# Patient Record
Sex: Female | Born: 1945 | Race: White | Hispanic: No | Smoking: Former smoker
Health system: Southern US, Community
[De-identification: ages and names within clinical notes are randomized; demographics above are authoritative.]

## PROBLEM LIST (undated history)

## (undated) DIAGNOSIS — G2581 Restless legs syndrome: Secondary | ICD-10-CM

## (undated) DIAGNOSIS — K52831 Collagenous colitis: Secondary | ICD-10-CM

## (undated) DIAGNOSIS — E785 Hyperlipidemia, unspecified: Secondary | ICD-10-CM

## (undated) DIAGNOSIS — H8110 Benign paroxysmal vertigo, unspecified ear: Secondary | ICD-10-CM

## (undated) DIAGNOSIS — N182 Chronic kidney disease, stage 2 (mild): Secondary | ICD-10-CM

## (undated) HISTORY — DX: Collagenous colitis: K52.831

## (undated) HISTORY — DX: Restless legs syndrome: G25.81

## (undated) HISTORY — DX: Benign paroxysmal vertigo, unspecified ear: H81.10

## (undated) HISTORY — DX: Hyperlipidemia, unspecified: E78.5

## (undated) HISTORY — PX: OTHER SURGICAL HISTORY: SHX169

## (undated) HISTORY — DX: Chronic kidney disease, stage 2 (mild): N18.2

---

## 1998-08-23 HISTORY — PX: BUNIONECTOMY: SHX129

## 1998-09-01 ENCOUNTER — Other Ambulatory Visit: Admission: RE | Admit: 1998-09-01 | Discharge: 1998-09-01 | Payer: Self-pay | Admitting: Obstetrics & Gynecology

## 1998-09-03 ENCOUNTER — Ambulatory Visit (HOSPITAL_COMMUNITY): Admission: RE | Admit: 1998-09-03 | Discharge: 1998-09-03 | Payer: Self-pay | Admitting: Family Medicine

## 1998-11-13 ENCOUNTER — Ambulatory Visit (HOSPITAL_BASED_OUTPATIENT_CLINIC_OR_DEPARTMENT_OTHER): Admission: RE | Admit: 1998-11-13 | Discharge: 1998-11-13 | Payer: Self-pay | Admitting: Orthopedic Surgery

## 1999-07-13 ENCOUNTER — Encounter: Payer: Self-pay | Admitting: Family Medicine

## 1999-07-13 ENCOUNTER — Encounter: Admission: RE | Admit: 1999-07-13 | Discharge: 1999-07-13 | Payer: Self-pay | Admitting: Family Medicine

## 1999-09-10 ENCOUNTER — Other Ambulatory Visit: Admission: RE | Admit: 1999-09-10 | Discharge: 1999-09-10 | Payer: Self-pay | Admitting: Obstetrics & Gynecology

## 2000-10-04 ENCOUNTER — Other Ambulatory Visit: Admission: RE | Admit: 2000-10-04 | Discharge: 2000-10-04 | Payer: Self-pay | Admitting: Obstetrics & Gynecology

## 2000-11-21 ENCOUNTER — Encounter: Admission: RE | Admit: 2000-11-21 | Discharge: 2000-11-21 | Payer: Self-pay | Admitting: Family Medicine

## 2000-11-21 ENCOUNTER — Encounter: Payer: Self-pay | Admitting: Family Medicine

## 2001-05-24 ENCOUNTER — Other Ambulatory Visit: Admission: RE | Admit: 2001-05-24 | Discharge: 2001-05-24 | Payer: Self-pay | Admitting: Obstetrics & Gynecology

## 2001-05-24 ENCOUNTER — Encounter (INDEPENDENT_AMBULATORY_CARE_PROVIDER_SITE_OTHER): Payer: Self-pay

## 2001-10-25 ENCOUNTER — Other Ambulatory Visit: Admission: RE | Admit: 2001-10-25 | Discharge: 2001-10-25 | Payer: Self-pay | Admitting: Obstetrics & Gynecology

## 2002-11-28 ENCOUNTER — Other Ambulatory Visit: Admission: RE | Admit: 2002-11-28 | Discharge: 2002-11-28 | Payer: Self-pay | Admitting: Obstetrics & Gynecology

## 2003-09-25 ENCOUNTER — Ambulatory Visit (HOSPITAL_COMMUNITY): Admission: RE | Admit: 2003-09-25 | Discharge: 2003-09-25 | Payer: Self-pay | Admitting: Radiation Oncology

## 2003-09-25 ENCOUNTER — Encounter (INDEPENDENT_AMBULATORY_CARE_PROVIDER_SITE_OTHER): Payer: Self-pay | Admitting: *Deleted

## 2004-01-13 ENCOUNTER — Other Ambulatory Visit: Admission: RE | Admit: 2004-01-13 | Discharge: 2004-01-13 | Payer: Self-pay | Admitting: Obstetrics & Gynecology

## 2005-02-10 ENCOUNTER — Other Ambulatory Visit: Admission: RE | Admit: 2005-02-10 | Discharge: 2005-02-10 | Payer: Self-pay | Admitting: Obstetrics & Gynecology

## 2005-02-12 ENCOUNTER — Encounter (INDEPENDENT_AMBULATORY_CARE_PROVIDER_SITE_OTHER): Payer: Self-pay | Admitting: Specialist

## 2005-02-12 ENCOUNTER — Encounter: Admission: RE | Admit: 2005-02-12 | Discharge: 2005-02-12 | Payer: Self-pay | Admitting: Obstetrics & Gynecology

## 2005-02-16 HISTORY — PX: BREAST BIOPSY: SHX20

## 2005-03-01 ENCOUNTER — Encounter: Admission: RE | Admit: 2005-03-01 | Discharge: 2005-03-01 | Payer: Self-pay | Admitting: Obstetrics & Gynecology

## 2005-03-08 ENCOUNTER — Encounter: Admission: RE | Admit: 2005-03-08 | Discharge: 2005-03-08 | Payer: Self-pay | Admitting: Obstetrics & Gynecology

## 2005-09-27 ENCOUNTER — Encounter: Admission: RE | Admit: 2005-09-27 | Discharge: 2005-09-27 | Payer: Self-pay | Admitting: Obstetrics & Gynecology

## 2006-04-20 ENCOUNTER — Encounter: Admission: RE | Admit: 2006-04-20 | Discharge: 2006-04-20 | Payer: Self-pay | Admitting: Obstetrics & Gynecology

## 2006-04-20 ENCOUNTER — Encounter: Admission: RE | Admit: 2006-04-20 | Discharge: 2006-04-20 | Payer: Self-pay | Admitting: Specialist

## 2006-04-27 ENCOUNTER — Encounter: Admission: RE | Admit: 2006-04-27 | Discharge: 2006-04-27 | Payer: Self-pay | Admitting: Obstetrics & Gynecology

## 2006-11-04 ENCOUNTER — Encounter: Admission: RE | Admit: 2006-11-04 | Discharge: 2006-11-04 | Payer: Self-pay | Admitting: Family Medicine

## 2007-05-02 ENCOUNTER — Encounter: Admission: RE | Admit: 2007-05-02 | Discharge: 2007-05-02 | Payer: Self-pay | Admitting: Obstetrics & Gynecology

## 2007-05-17 ENCOUNTER — Encounter: Admission: RE | Admit: 2007-05-17 | Discharge: 2007-05-17 | Payer: Self-pay | Admitting: Obstetrics & Gynecology

## 2008-05-03 ENCOUNTER — Encounter: Admission: RE | Admit: 2008-05-03 | Discharge: 2008-05-03 | Payer: Self-pay | Admitting: Obstetrics & Gynecology

## 2009-03-19 DIAGNOSIS — C4491 Basal cell carcinoma of skin, unspecified: Secondary | ICD-10-CM

## 2009-03-19 HISTORY — DX: Basal cell carcinoma of skin, unspecified: C44.91

## 2009-05-05 ENCOUNTER — Encounter: Admission: RE | Admit: 2009-05-05 | Discharge: 2009-05-05 | Payer: Self-pay | Admitting: Obstetrics & Gynecology

## 2010-05-07 ENCOUNTER — Encounter: Admission: RE | Admit: 2010-05-07 | Discharge: 2010-05-07 | Payer: Self-pay | Admitting: Obstetrics & Gynecology

## 2010-09-13 ENCOUNTER — Encounter: Payer: Self-pay | Admitting: Obstetrics & Gynecology

## 2011-01-08 NOTE — Op Note (Signed)
NAMEERCEL, NORMOYLE                              ACCOUNT NO.:  [id number]   MEDICAL RECORD NO.:  [id number]                   PATIENT TYPE:  AMB   LOCATION:  ENDO                                 FACILITY:  [facility]   PHYSICIAN:  Anselmo Rod, M.D.               DATE OF BIRTH:  [birth year]   DATE OF PROCEDURE:  09/25/2003  DATE OF DISCHARGE:                                 OPERATIVE REPORT   PROCEDURE PERFORMED:  Colonoscopy with random biopsies.   ENDOSCOPIST:  Anselmo Rod, M.D.   INSTRUMENT USED:  Olympus videocolonoscope.   INDICATION FOR THE PROCEDURE:  A 65 year old white female undergoing a  screening colonoscopy.  The patient has had a recent change in bowel habits  with looser stool, rule out colonic polyps, masses, etc.  Random colon  biopsies planned to rule out collagenous or microscopic colitis.   PREPROCEDURE PREPARATION:  Informed consent was procured from the patient.  The patient was fasted for eight hours prior to the procedure and prepped  with a bottle of magnesium citrate and a gallon of GoLYTELY the night prior  to the procedure.   PREPROCEDURE PHYSICAL:  VITAL SIGNS:  Stable.  NECK:  Supple.  CHEST:  Clear to auscultation.  S1 and S2 regular.  ABDOMEN:  Soft with normal bowel sounds.   DESCRIPTION OF THE PROCEDURE:  The patient was placed in the left lateral  decubitus position and sedated with 80 mg of Demerol and 8 mg of Versed  intravenously.  Once the patient was adequately sedated and maintained on  low-flow oxygen and continuous cardiac monitoring, the Olympus  videocolonoscope was advanced from the rectum to the cecum and terminal  ileum without difficulty.  There were a few sigmoid diverticula seen.  There  was some residual stool in the cecum.  The patient's position was changed  from the left lateral to the supine position with gentle application of  abdominal pressure.  The appendiceal orifice and ileocecal valve were  clearly visualized and  photographed.  Random colon biopsies were done to  rule out collagenous colitis.  Sigmoid diverticula were present, but these  were only in early changes of formation.  Small internal hemorrhoids were  seen on retroflexion.  No masses or polyps were seen.   IMPRESSION:  1. Small, nonbleeding internal hemorrhoids.  2. Early sigmoid diverticulosis.  3. Normal-appearing transverse colon, right colon, cecum, and terminal     ileum.   RECOMMENDATIONS:  1. Await pathology results.  2. Give patient brochures on diverticulosis.  3. Continue a high-fiber diet with liberal fluid intake.  4. Outpatient followup in the next two weeks for further recommendations.  Anselmo Rod, M.D.    JNM/MEDQ  D:  09/25/2003  T:  09/25/2003  Job:  [id number]   cc:   Renaye Rakers, M.D.  [phone] [address]  [place]  [place] [address]  Fax: [phone]

## 2011-04-28 ENCOUNTER — Other Ambulatory Visit: Payer: Self-pay | Admitting: Obstetrics & Gynecology

## 2011-04-28 DIAGNOSIS — Z1231 Encounter for screening mammogram for malignant neoplasm of breast: Secondary | ICD-10-CM

## 2011-05-11 ENCOUNTER — Ambulatory Visit: Payer: Self-pay

## 2011-05-14 ENCOUNTER — Ambulatory Visit
Admission: RE | Admit: 2011-05-14 | Discharge: 2011-05-14 | Disposition: A | Discharge status: Home / Self Care | Payer: Medicare Other | Source: Ambulatory Visit | Attending: Obstetrics & Gynecology | Admitting: Obstetrics & Gynecology

## 2011-05-14 DIAGNOSIS — Z1231 Encounter for screening mammogram for malignant neoplasm of breast: Secondary | ICD-10-CM

## 2011-08-25 DIAGNOSIS — G609 Hereditary and idiopathic neuropathy, unspecified: Secondary | ICD-10-CM | POA: Diagnosis not present

## 2011-08-25 DIAGNOSIS — M999 Biomechanical lesion, unspecified: Secondary | ICD-10-CM | POA: Diagnosis not present

## 2011-08-25 DIAGNOSIS — E785 Hyperlipidemia, unspecified: Secondary | ICD-10-CM | POA: Diagnosis not present

## 2011-08-25 DIAGNOSIS — M9981 Other biomechanical lesions of cervical region: Secondary | ICD-10-CM | POA: Diagnosis not present

## 2011-08-25 DIAGNOSIS — S139XXA Sprain of joints and ligaments of unspecified parts of neck, initial encounter: Secondary | ICD-10-CM | POA: Diagnosis not present

## 2011-08-25 DIAGNOSIS — S239XXA Sprain of unspecified parts of thorax, initial encounter: Secondary | ICD-10-CM | POA: Diagnosis not present

## 2011-08-25 DIAGNOSIS — M47817 Spondylosis without myelopathy or radiculopathy, lumbosacral region: Secondary | ICD-10-CM | POA: Diagnosis not present

## 2011-11-10 DIAGNOSIS — S239XXA Sprain of unspecified parts of thorax, initial encounter: Secondary | ICD-10-CM | POA: Diagnosis not present

## 2011-11-10 DIAGNOSIS — S139XXA Sprain of joints and ligaments of unspecified parts of neck, initial encounter: Secondary | ICD-10-CM | POA: Diagnosis not present

## 2011-11-10 DIAGNOSIS — M9981 Other biomechanical lesions of cervical region: Secondary | ICD-10-CM | POA: Diagnosis not present

## 2011-11-10 DIAGNOSIS — M999 Biomechanical lesion, unspecified: Secondary | ICD-10-CM | POA: Diagnosis not present

## 2011-11-10 DIAGNOSIS — M47817 Spondylosis without myelopathy or radiculopathy, lumbosacral region: Secondary | ICD-10-CM | POA: Diagnosis not present

## 2011-11-23 DIAGNOSIS — R197 Diarrhea, unspecified: Secondary | ICD-10-CM | POA: Diagnosis not present

## 2011-11-23 DIAGNOSIS — R1084 Generalized abdominal pain: Secondary | ICD-10-CM | POA: Diagnosis not present

## 2011-11-30 DIAGNOSIS — M999 Biomechanical lesion, unspecified: Secondary | ICD-10-CM | POA: Diagnosis not present

## 2011-11-30 DIAGNOSIS — M9981 Other biomechanical lesions of cervical region: Secondary | ICD-10-CM | POA: Diagnosis not present

## 2011-11-30 DIAGNOSIS — S139XXA Sprain of joints and ligaments of unspecified parts of neck, initial encounter: Secondary | ICD-10-CM | POA: Diagnosis not present

## 2011-11-30 DIAGNOSIS — S239XXA Sprain of unspecified parts of thorax, initial encounter: Secondary | ICD-10-CM | POA: Diagnosis not present

## 2011-11-30 DIAGNOSIS — M47817 Spondylosis without myelopathy or radiculopathy, lumbosacral region: Secondary | ICD-10-CM | POA: Diagnosis not present

## 2011-12-24 DIAGNOSIS — M9981 Other biomechanical lesions of cervical region: Secondary | ICD-10-CM | POA: Diagnosis not present

## 2011-12-24 DIAGNOSIS — M999 Biomechanical lesion, unspecified: Secondary | ICD-10-CM | POA: Diagnosis not present

## 2011-12-24 DIAGNOSIS — M47817 Spondylosis without myelopathy or radiculopathy, lumbosacral region: Secondary | ICD-10-CM | POA: Diagnosis not present

## 2011-12-24 DIAGNOSIS — S239XXA Sprain of unspecified parts of thorax, initial encounter: Secondary | ICD-10-CM | POA: Diagnosis not present

## 2011-12-24 DIAGNOSIS — S139XXA Sprain of joints and ligaments of unspecified parts of neck, initial encounter: Secondary | ICD-10-CM | POA: Diagnosis not present

## 2011-12-30 ENCOUNTER — Other Ambulatory Visit: Payer: Self-pay | Admitting: Internal Medicine

## 2011-12-30 DIAGNOSIS — R109 Unspecified abdominal pain: Secondary | ICD-10-CM | POA: Diagnosis not present

## 2011-12-30 DIAGNOSIS — N939 Abnormal uterine and vaginal bleeding, unspecified: Secondary | ICD-10-CM

## 2011-12-30 DIAGNOSIS — R1032 Left lower quadrant pain: Secondary | ICD-10-CM

## 2011-12-30 DIAGNOSIS — R1031 Right lower quadrant pain: Secondary | ICD-10-CM

## 2011-12-31 ENCOUNTER — Ambulatory Visit
Admission: RE | Admit: 2011-12-31 | Discharge: 2011-12-31 | Disposition: A | Discharge status: Home / Self Care | Payer: Medicare Other | Source: Ambulatory Visit | Attending: Internal Medicine | Admitting: Internal Medicine

## 2011-12-31 DIAGNOSIS — Z780 Asymptomatic menopausal state: Secondary | ICD-10-CM | POA: Diagnosis not present

## 2011-12-31 DIAGNOSIS — R9389 Abnormal findings on diagnostic imaging of other specified body structures: Secondary | ICD-10-CM | POA: Diagnosis not present

## 2011-12-31 DIAGNOSIS — R1032 Left lower quadrant pain: Secondary | ICD-10-CM

## 2011-12-31 DIAGNOSIS — N939 Abnormal uterine and vaginal bleeding, unspecified: Secondary | ICD-10-CM

## 2011-12-31 DIAGNOSIS — R109 Unspecified abdominal pain: Secondary | ICD-10-CM | POA: Diagnosis not present

## 2011-12-31 DIAGNOSIS — R1031 Right lower quadrant pain: Secondary | ICD-10-CM

## 2012-01-10 DIAGNOSIS — N83209 Unspecified ovarian cyst, unspecified side: Secondary | ICD-10-CM | POA: Diagnosis not present

## 2012-01-10 DIAGNOSIS — N950 Postmenopausal bleeding: Secondary | ICD-10-CM | POA: Diagnosis not present

## 2012-01-10 DIAGNOSIS — N8500 Endometrial hyperplasia, unspecified: Secondary | ICD-10-CM | POA: Diagnosis not present

## 2012-01-20 ENCOUNTER — Other Ambulatory Visit: Payer: Self-pay | Admitting: Obstetrics & Gynecology

## 2012-01-20 DIAGNOSIS — D260 Other benign neoplasm of cervix uteri: Secondary | ICD-10-CM | POA: Diagnosis not present

## 2012-01-20 DIAGNOSIS — N950 Postmenopausal bleeding: Secondary | ICD-10-CM | POA: Diagnosis not present

## 2012-01-20 DIAGNOSIS — R9389 Abnormal findings on diagnostic imaging of other specified body structures: Secondary | ICD-10-CM | POA: Diagnosis not present

## 2012-01-20 DIAGNOSIS — D261 Other benign neoplasm of corpus uteri: Secondary | ICD-10-CM | POA: Diagnosis not present

## 2012-02-07 DIAGNOSIS — M999 Biomechanical lesion, unspecified: Secondary | ICD-10-CM | POA: Diagnosis not present

## 2012-02-07 DIAGNOSIS — S239XXA Sprain of unspecified parts of thorax, initial encounter: Secondary | ICD-10-CM | POA: Diagnosis not present

## 2012-02-07 DIAGNOSIS — M9981 Other biomechanical lesions of cervical region: Secondary | ICD-10-CM | POA: Diagnosis not present

## 2012-02-07 DIAGNOSIS — M47817 Spondylosis without myelopathy or radiculopathy, lumbosacral region: Secondary | ICD-10-CM | POA: Diagnosis not present

## 2012-02-07 DIAGNOSIS — S139XXA Sprain of joints and ligaments of unspecified parts of neck, initial encounter: Secondary | ICD-10-CM | POA: Diagnosis not present

## 2012-02-22 DIAGNOSIS — E669 Obesity, unspecified: Secondary | ICD-10-CM | POA: Diagnosis not present

## 2012-02-22 DIAGNOSIS — G609 Hereditary and idiopathic neuropathy, unspecified: Secondary | ICD-10-CM | POA: Diagnosis not present

## 2012-02-22 DIAGNOSIS — Z0000 Encounter for general adult medical examination without abnormal findings: Secondary | ICD-10-CM | POA: Diagnosis not present

## 2012-02-22 DIAGNOSIS — E785 Hyperlipidemia, unspecified: Secondary | ICD-10-CM | POA: Diagnosis not present

## 2012-02-23 DIAGNOSIS — M79609 Pain in unspecified limb: Secondary | ICD-10-CM | POA: Diagnosis not present

## 2012-02-23 DIAGNOSIS — Q828 Other specified congenital malformations of skin: Secondary | ICD-10-CM | POA: Diagnosis not present

## 2012-02-23 DIAGNOSIS — B351 Tinea unguium: Secondary | ICD-10-CM | POA: Diagnosis not present

## 2012-02-29 DIAGNOSIS — H8110 Benign paroxysmal vertigo, unspecified ear: Secondary | ICD-10-CM | POA: Diagnosis not present

## 2012-02-29 DIAGNOSIS — E785 Hyperlipidemia, unspecified: Secondary | ICD-10-CM | POA: Diagnosis not present

## 2012-02-29 DIAGNOSIS — G609 Hereditary and idiopathic neuropathy, unspecified: Secondary | ICD-10-CM | POA: Diagnosis not present

## 2012-02-29 DIAGNOSIS — N182 Chronic kidney disease, stage 2 (mild): Secondary | ICD-10-CM | POA: Diagnosis not present

## 2012-03-01 DIAGNOSIS — M999 Biomechanical lesion, unspecified: Secondary | ICD-10-CM | POA: Diagnosis not present

## 2012-03-01 DIAGNOSIS — M47817 Spondylosis without myelopathy or radiculopathy, lumbosacral region: Secondary | ICD-10-CM | POA: Diagnosis not present

## 2012-03-01 DIAGNOSIS — S239XXA Sprain of unspecified parts of thorax, initial encounter: Secondary | ICD-10-CM | POA: Diagnosis not present

## 2012-03-01 DIAGNOSIS — M9981 Other biomechanical lesions of cervical region: Secondary | ICD-10-CM | POA: Diagnosis not present

## 2012-03-01 DIAGNOSIS — S139XXA Sprain of joints and ligaments of unspecified parts of neck, initial encounter: Secondary | ICD-10-CM | POA: Diagnosis not present

## 2012-04-21 ENCOUNTER — Other Ambulatory Visit: Payer: Self-pay | Admitting: Obstetrics & Gynecology

## 2012-04-21 DIAGNOSIS — Z1231 Encounter for screening mammogram for malignant neoplasm of breast: Secondary | ICD-10-CM

## 2012-04-26 DIAGNOSIS — M9981 Other biomechanical lesions of cervical region: Secondary | ICD-10-CM | POA: Diagnosis not present

## 2012-04-26 DIAGNOSIS — S239XXA Sprain of unspecified parts of thorax, initial encounter: Secondary | ICD-10-CM | POA: Diagnosis not present

## 2012-04-26 DIAGNOSIS — M47817 Spondylosis without myelopathy or radiculopathy, lumbosacral region: Secondary | ICD-10-CM | POA: Diagnosis not present

## 2012-04-26 DIAGNOSIS — M999 Biomechanical lesion, unspecified: Secondary | ICD-10-CM | POA: Diagnosis not present

## 2012-04-26 DIAGNOSIS — S139XXA Sprain of joints and ligaments of unspecified parts of neck, initial encounter: Secondary | ICD-10-CM | POA: Diagnosis not present

## 2012-05-01 DIAGNOSIS — Z23 Encounter for immunization: Secondary | ICD-10-CM | POA: Diagnosis not present

## 2012-05-01 DIAGNOSIS — N83209 Unspecified ovarian cyst, unspecified side: Secondary | ICD-10-CM | POA: Diagnosis not present

## 2012-05-01 DIAGNOSIS — N951 Menopausal and female climacteric states: Secondary | ICD-10-CM | POA: Diagnosis not present

## 2012-05-15 ENCOUNTER — Ambulatory Visit
Admission: RE | Admit: 2012-05-15 | Discharge: 2012-05-15 | Disposition: A | Discharge status: Home / Self Care | Payer: Medicare Other | Source: Ambulatory Visit | Attending: Obstetrics & Gynecology | Admitting: Obstetrics & Gynecology

## 2012-05-15 DIAGNOSIS — Z1231 Encounter for screening mammogram for malignant neoplasm of breast: Secondary | ICD-10-CM | POA: Diagnosis not present

## 2012-05-16 ENCOUNTER — Ambulatory Visit: Payer: Medicare Other

## 2012-05-24 DIAGNOSIS — M9981 Other biomechanical lesions of cervical region: Secondary | ICD-10-CM | POA: Diagnosis not present

## 2012-05-24 DIAGNOSIS — M47817 Spondylosis without myelopathy or radiculopathy, lumbosacral region: Secondary | ICD-10-CM | POA: Diagnosis not present

## 2012-05-24 DIAGNOSIS — M999 Biomechanical lesion, unspecified: Secondary | ICD-10-CM | POA: Diagnosis not present

## 2012-05-24 DIAGNOSIS — S239XXA Sprain of unspecified parts of thorax, initial encounter: Secondary | ICD-10-CM | POA: Diagnosis not present

## 2012-05-24 DIAGNOSIS — S139XXA Sprain of joints and ligaments of unspecified parts of neck, initial encounter: Secondary | ICD-10-CM | POA: Diagnosis not present

## 2012-06-15 DIAGNOSIS — H40019 Open angle with borderline findings, low risk, unspecified eye: Secondary | ICD-10-CM | POA: Diagnosis not present

## 2012-06-26 DIAGNOSIS — M9981 Other biomechanical lesions of cervical region: Secondary | ICD-10-CM | POA: Diagnosis not present

## 2012-06-26 DIAGNOSIS — S139XXA Sprain of joints and ligaments of unspecified parts of neck, initial encounter: Secondary | ICD-10-CM | POA: Diagnosis not present

## 2012-06-26 DIAGNOSIS — M47817 Spondylosis without myelopathy or radiculopathy, lumbosacral region: Secondary | ICD-10-CM | POA: Diagnosis not present

## 2012-06-26 DIAGNOSIS — M999 Biomechanical lesion, unspecified: Secondary | ICD-10-CM | POA: Diagnosis not present

## 2012-06-26 DIAGNOSIS — S239XXA Sprain of unspecified parts of thorax, initial encounter: Secondary | ICD-10-CM | POA: Diagnosis not present

## 2012-06-29 DIAGNOSIS — Q828 Other specified congenital malformations of skin: Secondary | ICD-10-CM | POA: Diagnosis not present

## 2012-07-21 DIAGNOSIS — S239XXA Sprain of unspecified parts of thorax, initial encounter: Secondary | ICD-10-CM | POA: Diagnosis not present

## 2012-07-21 DIAGNOSIS — S139XXA Sprain of joints and ligaments of unspecified parts of neck, initial encounter: Secondary | ICD-10-CM | POA: Diagnosis not present

## 2012-07-21 DIAGNOSIS — M999 Biomechanical lesion, unspecified: Secondary | ICD-10-CM | POA: Diagnosis not present

## 2012-07-21 DIAGNOSIS — M9981 Other biomechanical lesions of cervical region: Secondary | ICD-10-CM | POA: Diagnosis not present

## 2012-07-21 DIAGNOSIS — M47817 Spondylosis without myelopathy or radiculopathy, lumbosacral region: Secondary | ICD-10-CM | POA: Diagnosis not present

## 2012-11-10 DIAGNOSIS — M47817 Spondylosis without myelopathy or radiculopathy, lumbosacral region: Secondary | ICD-10-CM | POA: Diagnosis not present

## 2012-11-10 DIAGNOSIS — M999 Biomechanical lesion, unspecified: Secondary | ICD-10-CM | POA: Diagnosis not present

## 2012-11-10 DIAGNOSIS — M9981 Other biomechanical lesions of cervical region: Secondary | ICD-10-CM | POA: Diagnosis not present

## 2012-11-10 DIAGNOSIS — S239XXA Sprain of unspecified parts of thorax, initial encounter: Secondary | ICD-10-CM | POA: Diagnosis not present

## 2012-11-10 DIAGNOSIS — S139XXA Sprain of joints and ligaments of unspecified parts of neck, initial encounter: Secondary | ICD-10-CM | POA: Diagnosis not present

## 2012-11-22 DIAGNOSIS — E785 Hyperlipidemia, unspecified: Secondary | ICD-10-CM | POA: Diagnosis not present

## 2012-11-27 DIAGNOSIS — Z1212 Encounter for screening for malignant neoplasm of rectum: Secondary | ICD-10-CM | POA: Diagnosis not present

## 2012-11-27 DIAGNOSIS — Z130 Encounter for screening for diseases of the blood and blood-forming organs and certain disorders involving the immune mechanism: Secondary | ICD-10-CM | POA: Diagnosis not present

## 2012-11-27 DIAGNOSIS — Z124 Encounter for screening for malignant neoplasm of cervix: Secondary | ICD-10-CM | POA: Diagnosis not present

## 2012-11-29 DIAGNOSIS — G609 Hereditary and idiopathic neuropathy, unspecified: Secondary | ICD-10-CM | POA: Diagnosis not present

## 2012-11-29 DIAGNOSIS — E785 Hyperlipidemia, unspecified: Secondary | ICD-10-CM | POA: Diagnosis not present

## 2012-11-29 DIAGNOSIS — N182 Chronic kidney disease, stage 2 (mild): Secondary | ICD-10-CM | POA: Diagnosis not present

## 2012-12-25 DIAGNOSIS — S139XXA Sprain of joints and ligaments of unspecified parts of neck, initial encounter: Secondary | ICD-10-CM | POA: Diagnosis not present

## 2012-12-25 DIAGNOSIS — M999 Biomechanical lesion, unspecified: Secondary | ICD-10-CM | POA: Diagnosis not present

## 2012-12-25 DIAGNOSIS — S239XXA Sprain of unspecified parts of thorax, initial encounter: Secondary | ICD-10-CM | POA: Diagnosis not present

## 2012-12-25 DIAGNOSIS — M47817 Spondylosis without myelopathy or radiculopathy, lumbosacral region: Secondary | ICD-10-CM | POA: Diagnosis not present

## 2012-12-25 DIAGNOSIS — M9981 Other biomechanical lesions of cervical region: Secondary | ICD-10-CM | POA: Diagnosis not present

## 2013-01-22 DIAGNOSIS — S139XXA Sprain of joints and ligaments of unspecified parts of neck, initial encounter: Secondary | ICD-10-CM | POA: Diagnosis not present

## 2013-01-22 DIAGNOSIS — S239XXA Sprain of unspecified parts of thorax, initial encounter: Secondary | ICD-10-CM | POA: Diagnosis not present

## 2013-01-22 DIAGNOSIS — M999 Biomechanical lesion, unspecified: Secondary | ICD-10-CM | POA: Diagnosis not present

## 2013-01-22 DIAGNOSIS — M9981 Other biomechanical lesions of cervical region: Secondary | ICD-10-CM | POA: Diagnosis not present

## 2013-01-22 DIAGNOSIS — M47817 Spondylosis without myelopathy or radiculopathy, lumbosacral region: Secondary | ICD-10-CM | POA: Diagnosis not present

## 2013-01-24 DIAGNOSIS — M999 Biomechanical lesion, unspecified: Secondary | ICD-10-CM | POA: Diagnosis not present

## 2013-01-24 DIAGNOSIS — M47817 Spondylosis without myelopathy or radiculopathy, lumbosacral region: Secondary | ICD-10-CM | POA: Diagnosis not present

## 2013-01-24 DIAGNOSIS — M9981 Other biomechanical lesions of cervical region: Secondary | ICD-10-CM | POA: Diagnosis not present

## 2013-01-24 DIAGNOSIS — S139XXA Sprain of joints and ligaments of unspecified parts of neck, initial encounter: Secondary | ICD-10-CM | POA: Diagnosis not present

## 2013-01-24 DIAGNOSIS — S239XXA Sprain of unspecified parts of thorax, initial encounter: Secondary | ICD-10-CM | POA: Diagnosis not present

## 2013-01-26 DIAGNOSIS — M999 Biomechanical lesion, unspecified: Secondary | ICD-10-CM | POA: Diagnosis not present

## 2013-01-26 DIAGNOSIS — M47817 Spondylosis without myelopathy or radiculopathy, lumbosacral region: Secondary | ICD-10-CM | POA: Diagnosis not present

## 2013-01-26 DIAGNOSIS — S139XXA Sprain of joints and ligaments of unspecified parts of neck, initial encounter: Secondary | ICD-10-CM | POA: Diagnosis not present

## 2013-01-26 DIAGNOSIS — S239XXA Sprain of unspecified parts of thorax, initial encounter: Secondary | ICD-10-CM | POA: Diagnosis not present

## 2013-01-26 DIAGNOSIS — M9981 Other biomechanical lesions of cervical region: Secondary | ICD-10-CM | POA: Diagnosis not present

## 2013-01-31 DIAGNOSIS — S239XXA Sprain of unspecified parts of thorax, initial encounter: Secondary | ICD-10-CM | POA: Diagnosis not present

## 2013-01-31 DIAGNOSIS — M999 Biomechanical lesion, unspecified: Secondary | ICD-10-CM | POA: Diagnosis not present

## 2013-01-31 DIAGNOSIS — S139XXA Sprain of joints and ligaments of unspecified parts of neck, initial encounter: Secondary | ICD-10-CM | POA: Diagnosis not present

## 2013-01-31 DIAGNOSIS — M9981 Other biomechanical lesions of cervical region: Secondary | ICD-10-CM | POA: Diagnosis not present

## 2013-01-31 DIAGNOSIS — M47817 Spondylosis without myelopathy or radiculopathy, lumbosacral region: Secondary | ICD-10-CM | POA: Diagnosis not present

## 2013-02-09 DIAGNOSIS — S139XXA Sprain of joints and ligaments of unspecified parts of neck, initial encounter: Secondary | ICD-10-CM | POA: Diagnosis not present

## 2013-02-09 DIAGNOSIS — M47817 Spondylosis without myelopathy or radiculopathy, lumbosacral region: Secondary | ICD-10-CM | POA: Diagnosis not present

## 2013-02-09 DIAGNOSIS — M999 Biomechanical lesion, unspecified: Secondary | ICD-10-CM | POA: Diagnosis not present

## 2013-02-09 DIAGNOSIS — S239XXA Sprain of unspecified parts of thorax, initial encounter: Secondary | ICD-10-CM | POA: Diagnosis not present

## 2013-02-09 DIAGNOSIS — M9981 Other biomechanical lesions of cervical region: Secondary | ICD-10-CM | POA: Diagnosis not present

## 2013-03-27 DIAGNOSIS — M9981 Other biomechanical lesions of cervical region: Secondary | ICD-10-CM | POA: Diagnosis not present

## 2013-03-27 DIAGNOSIS — M999 Biomechanical lesion, unspecified: Secondary | ICD-10-CM | POA: Diagnosis not present

## 2013-03-27 DIAGNOSIS — S139XXA Sprain of joints and ligaments of unspecified parts of neck, initial encounter: Secondary | ICD-10-CM | POA: Diagnosis not present

## 2013-03-27 DIAGNOSIS — S239XXA Sprain of unspecified parts of thorax, initial encounter: Secondary | ICD-10-CM | POA: Diagnosis not present

## 2013-03-27 DIAGNOSIS — M47817 Spondylosis without myelopathy or radiculopathy, lumbosacral region: Secondary | ICD-10-CM | POA: Diagnosis not present

## 2013-03-28 DIAGNOSIS — Z23 Encounter for immunization: Secondary | ICD-10-CM | POA: Diagnosis not present

## 2013-04-10 ENCOUNTER — Other Ambulatory Visit: Payer: Self-pay

## 2013-04-10 DIAGNOSIS — Z1231 Encounter for screening mammogram for malignant neoplasm of breast: Secondary | ICD-10-CM

## 2013-04-27 DIAGNOSIS — M9981 Other biomechanical lesions of cervical region: Secondary | ICD-10-CM | POA: Diagnosis not present

## 2013-04-27 DIAGNOSIS — S139XXA Sprain of joints and ligaments of unspecified parts of neck, initial encounter: Secondary | ICD-10-CM | POA: Diagnosis not present

## 2013-04-27 DIAGNOSIS — M47817 Spondylosis without myelopathy or radiculopathy, lumbosacral region: Secondary | ICD-10-CM | POA: Diagnosis not present

## 2013-04-27 DIAGNOSIS — M999 Biomechanical lesion, unspecified: Secondary | ICD-10-CM | POA: Diagnosis not present

## 2013-04-27 DIAGNOSIS — S239XXA Sprain of unspecified parts of thorax, initial encounter: Secondary | ICD-10-CM | POA: Diagnosis not present

## 2013-05-01 DIAGNOSIS — R159 Full incontinence of feces: Secondary | ICD-10-CM | POA: Diagnosis not present

## 2013-05-01 DIAGNOSIS — R141 Gas pain: Secondary | ICD-10-CM | POA: Diagnosis not present

## 2013-05-01 DIAGNOSIS — K5190 Ulcerative colitis, unspecified, without complications: Secondary | ICD-10-CM | POA: Diagnosis not present

## 2013-05-17 ENCOUNTER — Ambulatory Visit
Admission: RE | Admit: 2013-05-17 | Discharge: 2013-05-17 | Disposition: A | Discharge status: Home / Self Care | Payer: Medicare Other | Source: Ambulatory Visit

## 2013-05-17 DIAGNOSIS — Z1231 Encounter for screening mammogram for malignant neoplasm of breast: Secondary | ICD-10-CM

## 2013-06-06 ENCOUNTER — Other Ambulatory Visit: Payer: Self-pay | Admitting: Obstetrics & Gynecology

## 2013-06-06 DIAGNOSIS — R922 Inconclusive mammogram: Secondary | ICD-10-CM

## 2013-06-11 DIAGNOSIS — N182 Chronic kidney disease, stage 2 (mild): Secondary | ICD-10-CM | POA: Diagnosis not present

## 2013-06-11 DIAGNOSIS — G609 Hereditary and idiopathic neuropathy, unspecified: Secondary | ICD-10-CM | POA: Diagnosis not present

## 2013-06-11 DIAGNOSIS — Z1331 Encounter for screening for depression: Secondary | ICD-10-CM | POA: Diagnosis not present

## 2013-06-11 DIAGNOSIS — Z0000 Encounter for general adult medical examination without abnormal findings: Secondary | ICD-10-CM | POA: Diagnosis not present

## 2013-06-11 DIAGNOSIS — E785 Hyperlipidemia, unspecified: Secondary | ICD-10-CM | POA: Diagnosis not present

## 2013-06-11 DIAGNOSIS — E669 Obesity, unspecified: Secondary | ICD-10-CM | POA: Diagnosis not present

## 2013-06-12 DIAGNOSIS — N950 Postmenopausal bleeding: Secondary | ICD-10-CM | POA: Diagnosis not present

## 2013-06-18 DIAGNOSIS — G609 Hereditary and idiopathic neuropathy, unspecified: Secondary | ICD-10-CM | POA: Diagnosis not present

## 2013-06-18 DIAGNOSIS — G2581 Restless legs syndrome: Secondary | ICD-10-CM | POA: Diagnosis not present

## 2013-06-18 DIAGNOSIS — E785 Hyperlipidemia, unspecified: Secondary | ICD-10-CM | POA: Diagnosis not present

## 2013-06-18 DIAGNOSIS — N182 Chronic kidney disease, stage 2 (mild): Secondary | ICD-10-CM | POA: Diagnosis not present

## 2013-06-21 DIAGNOSIS — H2510 Age-related nuclear cataract, unspecified eye: Secondary | ICD-10-CM | POA: Diagnosis not present

## 2013-06-21 DIAGNOSIS — H40019 Open angle with borderline findings, low risk, unspecified eye: Secondary | ICD-10-CM | POA: Diagnosis not present

## 2013-06-25 DIAGNOSIS — M47817 Spondylosis without myelopathy or radiculopathy, lumbosacral region: Secondary | ICD-10-CM | POA: Diagnosis not present

## 2013-06-25 DIAGNOSIS — S239XXA Sprain of unspecified parts of thorax, initial encounter: Secondary | ICD-10-CM | POA: Diagnosis not present

## 2013-06-25 DIAGNOSIS — M999 Biomechanical lesion, unspecified: Secondary | ICD-10-CM | POA: Diagnosis not present

## 2013-06-25 DIAGNOSIS — M9981 Other biomechanical lesions of cervical region: Secondary | ICD-10-CM | POA: Diagnosis not present

## 2013-06-25 DIAGNOSIS — S139XXA Sprain of joints and ligaments of unspecified parts of neck, initial encounter: Secondary | ICD-10-CM | POA: Diagnosis not present

## 2013-06-29 ENCOUNTER — Ambulatory Visit
Admission: RE | Admit: 2013-06-29 | Discharge: 2013-06-29 | Disposition: A | Discharge status: Home / Self Care | Payer: Medicare Other | Source: Ambulatory Visit | Attending: Obstetrics & Gynecology | Admitting: Obstetrics & Gynecology

## 2013-06-29 DIAGNOSIS — R922 Inconclusive mammogram: Secondary | ICD-10-CM

## 2013-06-29 MED ORDER — GADOBENATE DIMEGLUMINE 529 MG/ML IV SOLN
17.0000 mL | Freq: Once | INTRAVENOUS | Status: AC | PRN
  Administered 2013-06-29: 17 mL via INTRAVENOUS

## 2013-09-02 DIAGNOSIS — R059 Cough, unspecified: Secondary | ICD-10-CM | POA: Diagnosis not present

## 2013-09-02 DIAGNOSIS — J069 Acute upper respiratory infection, unspecified: Secondary | ICD-10-CM | POA: Diagnosis not present

## 2013-09-02 DIAGNOSIS — R05 Cough: Secondary | ICD-10-CM | POA: Diagnosis not present

## 2013-11-19 DIAGNOSIS — S139XXA Sprain of joints and ligaments of unspecified parts of neck, initial encounter: Secondary | ICD-10-CM | POA: Diagnosis not present

## 2013-11-19 DIAGNOSIS — S239XXA Sprain of unspecified parts of thorax, initial encounter: Secondary | ICD-10-CM | POA: Diagnosis not present

## 2013-11-19 DIAGNOSIS — M47817 Spondylosis without myelopathy or radiculopathy, lumbosacral region: Secondary | ICD-10-CM | POA: Diagnosis not present

## 2013-11-19 DIAGNOSIS — M9981 Other biomechanical lesions of cervical region: Secondary | ICD-10-CM | POA: Diagnosis not present

## 2013-11-19 DIAGNOSIS — M999 Biomechanical lesion, unspecified: Secondary | ICD-10-CM | POA: Diagnosis not present

## 2013-12-03 DIAGNOSIS — M999 Biomechanical lesion, unspecified: Secondary | ICD-10-CM | POA: Diagnosis not present

## 2013-12-03 DIAGNOSIS — S239XXA Sprain of unspecified parts of thorax, initial encounter: Secondary | ICD-10-CM | POA: Diagnosis not present

## 2013-12-03 DIAGNOSIS — S139XXA Sprain of joints and ligaments of unspecified parts of neck, initial encounter: Secondary | ICD-10-CM | POA: Diagnosis not present

## 2013-12-03 DIAGNOSIS — M47817 Spondylosis without myelopathy or radiculopathy, lumbosacral region: Secondary | ICD-10-CM | POA: Diagnosis not present

## 2013-12-03 DIAGNOSIS — M9981 Other biomechanical lesions of cervical region: Secondary | ICD-10-CM | POA: Diagnosis not present

## 2013-12-05 DIAGNOSIS — Z01419 Encounter for gynecological examination (general) (routine) without abnormal findings: Secondary | ICD-10-CM | POA: Diagnosis not present

## 2013-12-10 DIAGNOSIS — E785 Hyperlipidemia, unspecified: Secondary | ICD-10-CM | POA: Diagnosis not present

## 2013-12-17 DIAGNOSIS — H8110 Benign paroxysmal vertigo, unspecified ear: Secondary | ICD-10-CM | POA: Diagnosis not present

## 2013-12-17 DIAGNOSIS — E785 Hyperlipidemia, unspecified: Secondary | ICD-10-CM | POA: Diagnosis not present

## 2013-12-17 DIAGNOSIS — N182 Chronic kidney disease, stage 2 (mild): Secondary | ICD-10-CM | POA: Diagnosis not present

## 2013-12-17 DIAGNOSIS — G609 Hereditary and idiopathic neuropathy, unspecified: Secondary | ICD-10-CM | POA: Diagnosis not present

## 2013-12-18 DIAGNOSIS — M47817 Spondylosis without myelopathy or radiculopathy, lumbosacral region: Secondary | ICD-10-CM | POA: Diagnosis not present

## 2013-12-18 DIAGNOSIS — M999 Biomechanical lesion, unspecified: Secondary | ICD-10-CM | POA: Diagnosis not present

## 2013-12-18 DIAGNOSIS — M9981 Other biomechanical lesions of cervical region: Secondary | ICD-10-CM | POA: Diagnosis not present

## 2013-12-18 DIAGNOSIS — S139XXA Sprain of joints and ligaments of unspecified parts of neck, initial encounter: Secondary | ICD-10-CM | POA: Diagnosis not present

## 2013-12-18 DIAGNOSIS — S239XXA Sprain of unspecified parts of thorax, initial encounter: Secondary | ICD-10-CM | POA: Diagnosis not present

## 2013-12-21 ENCOUNTER — Encounter: Payer: Self-pay | Admitting: Podiatrist

## 2013-12-21 ENCOUNTER — Ambulatory Visit (INDEPENDENT_AMBULATORY_CARE_PROVIDER_SITE_OTHER): Payer: Medicare Other | Admitting: Podiatrist

## 2013-12-21 VITALS — BP 129/82 | HR 73 | Resp 18

## 2013-12-21 DIAGNOSIS — M216X9 Other acquired deformities of unspecified foot: Secondary | ICD-10-CM | POA: Diagnosis not present

## 2013-12-21 DIAGNOSIS — Q828 Other specified congenital malformations of skin: Secondary | ICD-10-CM

## 2013-12-21 DIAGNOSIS — L84 Corns and callosities: Secondary | ICD-10-CM | POA: Diagnosis not present

## 2013-12-21 NOTE — Progress Notes (Signed)
   Subjective:    Patient ID: Norma Nicholson, female    DOB: [birth year], 68 y.o.   MRN: [id number]  HPI I have calluses on both feet and has been bothering me for about two months and hurts with shoes and hurts to stand on them    Review of Systems  Constitutional: Positive for unexpected weight change.  HENT:       Ringing in ears  Musculoskeletal: Positive for gait problem.  All other systems reviewed and are negative.      Objective:   Physical Exam GENERAL APPEARANCE: Alert, conversant. Appropriately groomed. No acute distress.  VASCULAR: Pedal pulses palpable at 2/4 DP and PT bilateral.  Capillary refill time is immediate to all digits,  Proximal to distal cooling it warm to warm.  Digital hair growth is present bilateral  NEUROLOGIC: sensation is intact epicritically and protectively to 5.07 monofilament at 5/5 sites bilateral.  Light touch is intact bilateral, vibratory sensation intact bilateral, achilles tendon reflex is intact bilateral.  MUSCULOSKELETAL: prominent metatarsal heads noted bilateral 1,5  DERMATOLOGIC: hyperkeratotic lesion present submet 1,5 left foot.  Otherwise on the right foot skin color, texture, and turger are within normal limits.      Assessment & Plan:  Prominent metatarsal head left foot, callus left  Plan:  Recommended shoe gear changes, orthotics to offload boney prominences and metatarsal bars from dennys shoe repair.  She would like the calluses to go away and i explained that taking the pressure off is the way to accomlish this.  She will be seen back if she would like to consider orthotics

## 2013-12-21 NOTE — Patient Instructions (Signed)

## 2013-12-31 DIAGNOSIS — K620 Anal polyp: Secondary | ICD-10-CM | POA: Diagnosis not present

## 2013-12-31 DIAGNOSIS — Z1211 Encounter for screening for malignant neoplasm of colon: Secondary | ICD-10-CM | POA: Diagnosis not present

## 2013-12-31 DIAGNOSIS — D129 Benign neoplasm of anus and anal canal: Secondary | ICD-10-CM | POA: Diagnosis not present

## 2013-12-31 DIAGNOSIS — K5730 Diverticulosis of large intestine without perforation or abscess without bleeding: Secondary | ICD-10-CM | POA: Diagnosis not present

## 2013-12-31 DIAGNOSIS — D128 Benign neoplasm of rectum: Secondary | ICD-10-CM | POA: Diagnosis not present

## 2013-12-31 DIAGNOSIS — R198 Other specified symptoms and signs involving the digestive system and abdomen: Secondary | ICD-10-CM | POA: Diagnosis not present

## 2013-12-31 DIAGNOSIS — D126 Benign neoplasm of colon, unspecified: Secondary | ICD-10-CM | POA: Diagnosis not present

## 2013-12-31 DIAGNOSIS — K621 Rectal polyp: Secondary | ICD-10-CM | POA: Diagnosis not present

## 2014-01-03 DIAGNOSIS — S239XXA Sprain of unspecified parts of thorax, initial encounter: Secondary | ICD-10-CM | POA: Diagnosis not present

## 2014-01-03 DIAGNOSIS — M9981 Other biomechanical lesions of cervical region: Secondary | ICD-10-CM | POA: Diagnosis not present

## 2014-01-03 DIAGNOSIS — M47817 Spondylosis without myelopathy or radiculopathy, lumbosacral region: Secondary | ICD-10-CM | POA: Diagnosis not present

## 2014-01-03 DIAGNOSIS — M999 Biomechanical lesion, unspecified: Secondary | ICD-10-CM | POA: Diagnosis not present

## 2014-01-03 DIAGNOSIS — S139XXA Sprain of joints and ligaments of unspecified parts of neck, initial encounter: Secondary | ICD-10-CM | POA: Diagnosis not present

## 2014-01-17 DIAGNOSIS — K5730 Diverticulosis of large intestine without perforation or abscess without bleeding: Secondary | ICD-10-CM | POA: Diagnosis not present

## 2014-01-17 DIAGNOSIS — K5190 Ulcerative colitis, unspecified, without complications: Secondary | ICD-10-CM | POA: Diagnosis not present

## 2014-02-04 ENCOUNTER — Other Ambulatory Visit: Payer: Self-pay | Admitting: Dermatology

## 2014-02-04 DIAGNOSIS — L820 Inflamed seborrheic keratosis: Secondary | ICD-10-CM | POA: Diagnosis not present

## 2014-02-04 DIAGNOSIS — C44519 Basal cell carcinoma of skin of other part of trunk: Secondary | ICD-10-CM | POA: Diagnosis not present

## 2014-02-04 DIAGNOSIS — L570 Actinic keratosis: Secondary | ICD-10-CM | POA: Diagnosis not present

## 2014-02-05 DIAGNOSIS — D485 Neoplasm of uncertain behavior of skin: Secondary | ICD-10-CM | POA: Diagnosis not present

## 2014-02-05 DIAGNOSIS — C44519 Basal cell carcinoma of skin of other part of trunk: Secondary | ICD-10-CM | POA: Diagnosis not present

## 2014-02-08 DIAGNOSIS — M9981 Other biomechanical lesions of cervical region: Secondary | ICD-10-CM | POA: Diagnosis not present

## 2014-02-08 DIAGNOSIS — S139XXA Sprain of joints and ligaments of unspecified parts of neck, initial encounter: Secondary | ICD-10-CM | POA: Diagnosis not present

## 2014-02-08 DIAGNOSIS — M999 Biomechanical lesion, unspecified: Secondary | ICD-10-CM | POA: Diagnosis not present

## 2014-02-08 DIAGNOSIS — M47817 Spondylosis without myelopathy or radiculopathy, lumbosacral region: Secondary | ICD-10-CM | POA: Diagnosis not present

## 2014-02-08 DIAGNOSIS — S239XXA Sprain of unspecified parts of thorax, initial encounter: Secondary | ICD-10-CM | POA: Diagnosis not present

## 2014-02-13 DIAGNOSIS — S139XXA Sprain of joints and ligaments of unspecified parts of neck, initial encounter: Secondary | ICD-10-CM | POA: Diagnosis not present

## 2014-02-13 DIAGNOSIS — M47817 Spondylosis without myelopathy or radiculopathy, lumbosacral region: Secondary | ICD-10-CM | POA: Diagnosis not present

## 2014-02-13 DIAGNOSIS — M9981 Other biomechanical lesions of cervical region: Secondary | ICD-10-CM | POA: Diagnosis not present

## 2014-02-13 DIAGNOSIS — R42 Dizziness and giddiness: Secondary | ICD-10-CM | POA: Diagnosis not present

## 2014-02-13 DIAGNOSIS — M999 Biomechanical lesion, unspecified: Secondary | ICD-10-CM | POA: Diagnosis not present

## 2014-02-13 DIAGNOSIS — S239XXA Sprain of unspecified parts of thorax, initial encounter: Secondary | ICD-10-CM | POA: Diagnosis not present

## 2014-04-05 ENCOUNTER — Ambulatory Visit (INDEPENDENT_AMBULATORY_CARE_PROVIDER_SITE_OTHER): Payer: Medicare Other | Admitting: Podiatrist

## 2014-04-05 DIAGNOSIS — M216X9 Other acquired deformities of unspecified foot: Secondary | ICD-10-CM

## 2014-04-05 DIAGNOSIS — Q828 Other specified congenital malformations of skin: Secondary | ICD-10-CM

## 2014-04-05 DIAGNOSIS — L84 Corns and callosities: Secondary | ICD-10-CM

## 2014-04-05 NOTE — Progress Notes (Signed)
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 She states she's able use a pumice stone on the calluses and knees are better. She does however get a deep core on the left foot which she is unable to treat on her own. She has tried different shoes, and metatarsal bars  and she states that the metatarsal bars do seem to have helped.  Objective: Neurovascular status is intact. Well-circumscribed porokeratotic lesion is present submetatarsal 5 of the left foot. It has a waxy-like ground glass appearance present. Intact integument is present post debridement. No redness, swelling or signs of infection is present.  Assessment: Porokeratotic lesion x1 left foot  Plan: Excision and debridement of the lesion is accomplished today without complication. Salinocaine is packed into the lesion and Band-Aid applied. She will be seen back as needed for followup she was given instructions for use of salicylic acid as an at-home therapy option.

## 2014-04-05 NOTE — Patient Instructions (Signed)
Salicylic Acid topical gel, cream, lotion, solution What is this medicine? SALICYCLIC ACID (SAL i SIL ik AS id) breaks down layers of thick skin. It is used to treat common and plantar warts, psoriasis, calluses, and corns. It is also used to treat or to prevent acne. This medicine may be used for other purposes; ask your health care provider or pharmacist if you have questions. COMMON BRAND NAME(S): [place]bon Jabs, Clear Away Liquid, Compound W, Corn/Callus Remover, Dermarest Psoriasis Moisturizer, Dermarest Psoriasis Overnight Treatment, Dermarest Psoriasis Scalp Treatment, Dermarest Psoriasis Skin Treatment, DuoFilm Wart Remover, [place]ofilm, Hydrisalic, Keralyt, Neutrogena Acne Wash, [place], RE SA, SalAC, [employer], [place], [place], [place], [place] 2 in 1 Anti-Dandruff, [place], [place], Wart-Off What should I tell my health care provider before I take this medicine? They need to know if you have any of these conditions: -child with chickenpox, the flu, or other viral infection -kidney disease -liver disease -an unusual or allergic reaction to salicylic acid, other medicines, foods, dyes, or preservatives -pregnant or trying to get pregnant -breast-feeding How should I use this medicine? This medicine is for external use only. Follow the directions on the label. Do not apply to raw or irritated skin. Avoid getting medicine in your eyes, lips, nose, mouth, or other sensitive areas. Use this medicine at regular intervals. Do not use more often than directed. Talk to your pediatrician regarding the use of this medicine in children. Special care may be needed. This medicine is not approved for use in children under 43 years old. Overdosage: If you think you have taken too much of this medicine contact a poison control center or emergency room at once. NOTE: This medicine is only for you. Do not share this medicine with others. What if I miss a dose? If you miss a dose, use it as soon as you  can. If it is almost time for your next dose, use only that dose. Do not use double or extra doses. What may interact with this medicine? -medicines that change urine pH like ammonium chloride, sodium bicarbonate, and others -medicines that treat or prevent blood clots like warfarin -methotrexate -pyrazinamide -some medicines for diabetes -some medicines for gout -steroid medicines like prednisone or cortisone This list may not describe all possible interactions. Give your health care provider a list of all the medicines, herbs, non-prescription drugs, or dietary supplements you use. Also tell them if you smoke, drink alcohol, or use illegal drugs. Some items may interact with your medicine. What should I watch for while using this medicine? Tell your doctor is your symptoms do not get better or if they get worse. This medicine can make you more sensitive to the sun. Keep out of the sun. If you cannot avoid being in the sun, wear protective clothing and use sunscreen. Do not use sun lamps or tanning beds/booths. Use of this medicine in children under 12 years or in patients with kidney or liver disease may increase the risk of serious side effects. These patients should not use this medicine over large areas of skin. If you notice symptoms such as nausea, vomiting, dizziness, loss of hearing, ringing in the ears, unusual weakness or tiredness, fast or labored breathing, diarrhea, or confusion, stop using this medicine and contact your doctor or health care professional. What side effects may I notice from receiving this medicine? Side effects that you should report to your doctor or health care professional as soon as possible: -allergic reactions like skin rash, itching or hives, swelling of the face,  lips, or tongue Side effects that usually do not require medical attention (report to your doctor or health care professional if they continue or are bothersome): -skin irritation This list may not  describe all possible side effects. Call your doctor for medical advice about side effects. You may report side effects to FDA at 1-800-FDA-1088. Where should I keep my medicine? Keep out of the reach of children. Store at room temperature between 15 and 30 degrees C (59 and 86 degrees F). Do not freeze. Throw away any unused medicine after the expiration date. NOTE: This sheet is a summary. It may not cover all possible information. If you have questions about this medicine, talk to your doctor, pharmacist, or health care provider.  2015, Elsevier/Gold Standard. (2008-04-12 13:36:20)

## 2014-04-10 DIAGNOSIS — M47817 Spondylosis without myelopathy or radiculopathy, lumbosacral region: Secondary | ICD-10-CM | POA: Diagnosis not present

## 2014-04-10 DIAGNOSIS — M999 Biomechanical lesion, unspecified: Secondary | ICD-10-CM | POA: Diagnosis not present

## 2014-04-10 DIAGNOSIS — M9981 Other biomechanical lesions of cervical region: Secondary | ICD-10-CM | POA: Diagnosis not present

## 2014-04-10 DIAGNOSIS — S239XXA Sprain of unspecified parts of thorax, initial encounter: Secondary | ICD-10-CM | POA: Diagnosis not present

## 2014-04-10 DIAGNOSIS — S139XXA Sprain of joints and ligaments of unspecified parts of neck, initial encounter: Secondary | ICD-10-CM | POA: Diagnosis not present

## 2014-04-17 ENCOUNTER — Other Ambulatory Visit: Payer: Self-pay

## 2014-04-17 DIAGNOSIS — Z1231 Encounter for screening mammogram for malignant neoplasm of breast: Secondary | ICD-10-CM

## 2014-04-18 DIAGNOSIS — C44519 Basal cell carcinoma of skin of other part of trunk: Secondary | ICD-10-CM | POA: Diagnosis not present

## 2014-05-08 DIAGNOSIS — M999 Biomechanical lesion, unspecified: Secondary | ICD-10-CM | POA: Diagnosis not present

## 2014-05-08 DIAGNOSIS — S239XXA Sprain of unspecified parts of thorax, initial encounter: Secondary | ICD-10-CM | POA: Diagnosis not present

## 2014-05-08 DIAGNOSIS — S139XXA Sprain of joints and ligaments of unspecified parts of neck, initial encounter: Secondary | ICD-10-CM | POA: Diagnosis not present

## 2014-05-08 DIAGNOSIS — M47817 Spondylosis without myelopathy or radiculopathy, lumbosacral region: Secondary | ICD-10-CM | POA: Diagnosis not present

## 2014-05-08 DIAGNOSIS — M9981 Other biomechanical lesions of cervical region: Secondary | ICD-10-CM | POA: Diagnosis not present

## 2014-05-20 ENCOUNTER — Ambulatory Visit
Admission: RE | Admit: 2014-05-20 | Discharge: 2014-05-20 | Disposition: A | Discharge status: Home / Self Care | Payer: Medicare Other | Source: Ambulatory Visit

## 2014-05-20 DIAGNOSIS — Z1231 Encounter for screening mammogram for malignant neoplasm of breast: Secondary | ICD-10-CM

## 2014-06-07 DIAGNOSIS — M9901 Segmental and somatic dysfunction of cervical region: Secondary | ICD-10-CM | POA: Diagnosis not present

## 2014-06-07 DIAGNOSIS — S134XXA Sprain of ligaments of cervical spine, initial encounter: Secondary | ICD-10-CM | POA: Diagnosis not present

## 2014-06-07 DIAGNOSIS — M9902 Segmental and somatic dysfunction of thoracic region: Secondary | ICD-10-CM | POA: Diagnosis not present

## 2014-06-07 DIAGNOSIS — M9903 Segmental and somatic dysfunction of lumbar region: Secondary | ICD-10-CM | POA: Diagnosis not present

## 2014-06-07 DIAGNOSIS — M47817 Spondylosis without myelopathy or radiculopathy, lumbosacral region: Secondary | ICD-10-CM | POA: Diagnosis not present

## 2014-06-07 DIAGNOSIS — S233XXA Sprain of ligaments of thoracic spine, initial encounter: Secondary | ICD-10-CM | POA: Diagnosis not present

## 2014-06-18 DIAGNOSIS — Z1389 Encounter for screening for other disorder: Secondary | ICD-10-CM | POA: Diagnosis not present

## 2014-06-18 DIAGNOSIS — E785 Hyperlipidemia, unspecified: Secondary | ICD-10-CM | POA: Diagnosis not present

## 2014-06-18 DIAGNOSIS — Z23 Encounter for immunization: Secondary | ICD-10-CM | POA: Diagnosis not present

## 2014-06-18 DIAGNOSIS — Z0000 Encounter for general adult medical examination without abnormal findings: Secondary | ICD-10-CM | POA: Diagnosis not present

## 2014-06-25 DIAGNOSIS — G2581 Restless legs syndrome: Secondary | ICD-10-CM | POA: Diagnosis not present

## 2014-06-25 DIAGNOSIS — G609 Hereditary and idiopathic neuropathy, unspecified: Secondary | ICD-10-CM | POA: Diagnosis not present

## 2014-06-25 DIAGNOSIS — H8110 Benign paroxysmal vertigo, unspecified ear: Secondary | ICD-10-CM | POA: Diagnosis not present

## 2014-06-25 DIAGNOSIS — N182 Chronic kidney disease, stage 2 (mild): Secondary | ICD-10-CM | POA: Diagnosis not present

## 2014-06-25 DIAGNOSIS — E785 Hyperlipidemia, unspecified: Secondary | ICD-10-CM | POA: Diagnosis not present

## 2014-06-27 DIAGNOSIS — H40013 Open angle with borderline findings, low risk, bilateral: Secondary | ICD-10-CM | POA: Diagnosis not present

## 2014-06-27 DIAGNOSIS — H2513 Age-related nuclear cataract, bilateral: Secondary | ICD-10-CM | POA: Diagnosis not present

## 2014-07-05 DIAGNOSIS — M47817 Spondylosis without myelopathy or radiculopathy, lumbosacral region: Secondary | ICD-10-CM | POA: Diagnosis not present

## 2014-07-05 DIAGNOSIS — M9901 Segmental and somatic dysfunction of cervical region: Secondary | ICD-10-CM | POA: Diagnosis not present

## 2014-07-05 DIAGNOSIS — S233XXA Sprain of ligaments of thoracic spine, initial encounter: Secondary | ICD-10-CM | POA: Diagnosis not present

## 2014-07-05 DIAGNOSIS — M9903 Segmental and somatic dysfunction of lumbar region: Secondary | ICD-10-CM | POA: Diagnosis not present

## 2014-07-05 DIAGNOSIS — M9902 Segmental and somatic dysfunction of thoracic region: Secondary | ICD-10-CM | POA: Diagnosis not present

## 2014-07-05 DIAGNOSIS — S134XXA Sprain of ligaments of cervical spine, initial encounter: Secondary | ICD-10-CM | POA: Diagnosis not present

## 2014-07-30 ENCOUNTER — Other Ambulatory Visit: Payer: Self-pay | Admitting: Dermatology

## 2014-07-30 DIAGNOSIS — L820 Inflamed seborrheic keratosis: Secondary | ICD-10-CM | POA: Diagnosis not present

## 2014-07-30 DIAGNOSIS — D485 Neoplasm of uncertain behavior of skin: Secondary | ICD-10-CM | POA: Diagnosis not present

## 2014-07-30 DIAGNOSIS — L821 Other seborrheic keratosis: Secondary | ICD-10-CM | POA: Diagnosis not present

## 2014-08-09 DIAGNOSIS — M9903 Segmental and somatic dysfunction of lumbar region: Secondary | ICD-10-CM | POA: Diagnosis not present

## 2014-08-09 DIAGNOSIS — M47817 Spondylosis without myelopathy or radiculopathy, lumbosacral region: Secondary | ICD-10-CM | POA: Diagnosis not present

## 2014-08-09 DIAGNOSIS — M9901 Segmental and somatic dysfunction of cervical region: Secondary | ICD-10-CM | POA: Diagnosis not present

## 2014-08-09 DIAGNOSIS — M9902 Segmental and somatic dysfunction of thoracic region: Secondary | ICD-10-CM | POA: Diagnosis not present

## 2014-08-09 DIAGNOSIS — S138XXA Sprain of joints and ligaments of other parts of neck, initial encounter: Secondary | ICD-10-CM | POA: Diagnosis not present

## 2014-08-09 DIAGNOSIS — S29012A Strain of muscle and tendon of back wall of thorax, initial encounter: Secondary | ICD-10-CM | POA: Diagnosis not present

## 2014-09-30 DIAGNOSIS — M205X2 Other deformities of toe(s) (acquired), left foot: Secondary | ICD-10-CM | POA: Diagnosis not present

## 2014-09-30 DIAGNOSIS — M79604 Pain in right leg: Secondary | ICD-10-CM | POA: Diagnosis not present

## 2014-11-05 DIAGNOSIS — M9901 Segmental and somatic dysfunction of cervical region: Secondary | ICD-10-CM | POA: Diagnosis not present

## 2014-11-05 DIAGNOSIS — M9902 Segmental and somatic dysfunction of thoracic region: Secondary | ICD-10-CM | POA: Diagnosis not present

## 2014-11-05 DIAGNOSIS — S29012A Strain of muscle and tendon of back wall of thorax, initial encounter: Secondary | ICD-10-CM | POA: Diagnosis not present

## 2014-11-05 DIAGNOSIS — M47817 Spondylosis without myelopathy or radiculopathy, lumbosacral region: Secondary | ICD-10-CM | POA: Diagnosis not present

## 2014-11-05 DIAGNOSIS — M9903 Segmental and somatic dysfunction of lumbar region: Secondary | ICD-10-CM | POA: Diagnosis not present

## 2014-11-05 DIAGNOSIS — S138XXA Sprain of joints and ligaments of other parts of neck, initial encounter: Secondary | ICD-10-CM | POA: Diagnosis not present

## 2014-12-03 DIAGNOSIS — M9903 Segmental and somatic dysfunction of lumbar region: Secondary | ICD-10-CM | POA: Diagnosis not present

## 2014-12-03 DIAGNOSIS — S29012A Strain of muscle and tendon of back wall of thorax, initial encounter: Secondary | ICD-10-CM | POA: Diagnosis not present

## 2014-12-03 DIAGNOSIS — M47817 Spondylosis without myelopathy or radiculopathy, lumbosacral region: Secondary | ICD-10-CM | POA: Diagnosis not present

## 2014-12-03 DIAGNOSIS — M9902 Segmental and somatic dysfunction of thoracic region: Secondary | ICD-10-CM | POA: Diagnosis not present

## 2014-12-03 DIAGNOSIS — S138XXA Sprain of joints and ligaments of other parts of neck, initial encounter: Secondary | ICD-10-CM | POA: Diagnosis not present

## 2014-12-03 DIAGNOSIS — M9901 Segmental and somatic dysfunction of cervical region: Secondary | ICD-10-CM | POA: Diagnosis not present

## 2014-12-05 ENCOUNTER — Ambulatory Visit (INDEPENDENT_AMBULATORY_CARE_PROVIDER_SITE_OTHER): Payer: Medicare Other | Admitting: Podiatry

## 2014-12-05 ENCOUNTER — Encounter: Payer: Self-pay | Admitting: Podiatry

## 2014-12-05 DIAGNOSIS — M779 Enthesopathy, unspecified: Secondary | ICD-10-CM

## 2014-12-05 DIAGNOSIS — L84 Corns and callosities: Secondary | ICD-10-CM | POA: Diagnosis not present

## 2014-12-05 MED ORDER — TRIAMCINOLONE ACETONIDE 10 MG/ML IJ SUSP
10.0000 mg | Freq: Once | INTRAMUSCULAR | Status: AC
  Administered 2014-12-05: 10 mg

## 2014-12-05 NOTE — Progress Notes (Signed)
Subjective:     Patient ID: Norma Nicholson, female   DOB: 1946-06-30, 69 y.o.   MRN: [id number]  HPI patient states I get a lot of inflammation around this fifth metatarsal head left and this lesion and is no longer lasting any were near as much as it used to with approximate 2 months of relief   Review of Systems     Objective:   Physical Exam Neurovascular status intact with muscle strength adequate and fluid around the fifth metatarsal head left with a porokeratotic type lucent lesion present    Assessment:     Inflammatory capsulitis fifth MPJ with lucent porokeratotic type lesion    Plan:     Reviewed that we can try injection to see if it would reduce the inflammation and maybe by more time but ultimately this may require fifth metatarsal head resection which I made her aware of. Today I did a careful injection with a quarter cc of dexamethasone with a small amount of Kenalog Xylocaine and then debrided the lesion and reappoint when symptoms indicate

## 2014-12-09 ENCOUNTER — Other Ambulatory Visit: Payer: Self-pay | Admitting: Obstetrics & Gynecology

## 2014-12-09 DIAGNOSIS — Z01419 Encounter for gynecological examination (general) (routine) without abnormal findings: Secondary | ICD-10-CM | POA: Diagnosis not present

## 2014-12-09 DIAGNOSIS — Z6827 Body mass index (BMI) 27.0-27.9, adult: Secondary | ICD-10-CM | POA: Diagnosis not present

## 2014-12-09 DIAGNOSIS — Z124 Encounter for screening for malignant neoplasm of cervix: Secondary | ICD-10-CM | POA: Diagnosis not present

## 2014-12-10 LAB — CYTOLOGY - PAP

## 2014-12-24 DIAGNOSIS — S29012A Strain of muscle and tendon of back wall of thorax, initial encounter: Secondary | ICD-10-CM | POA: Diagnosis not present

## 2014-12-24 DIAGNOSIS — M47817 Spondylosis without myelopathy or radiculopathy, lumbosacral region: Secondary | ICD-10-CM | POA: Diagnosis not present

## 2014-12-24 DIAGNOSIS — S138XXA Sprain of joints and ligaments of other parts of neck, initial encounter: Secondary | ICD-10-CM | POA: Diagnosis not present

## 2014-12-24 DIAGNOSIS — E785 Hyperlipidemia, unspecified: Secondary | ICD-10-CM | POA: Diagnosis not present

## 2014-12-24 DIAGNOSIS — M9903 Segmental and somatic dysfunction of lumbar region: Secondary | ICD-10-CM | POA: Diagnosis not present

## 2014-12-24 DIAGNOSIS — M9902 Segmental and somatic dysfunction of thoracic region: Secondary | ICD-10-CM | POA: Diagnosis not present

## 2014-12-24 DIAGNOSIS — M9901 Segmental and somatic dysfunction of cervical region: Secondary | ICD-10-CM | POA: Diagnosis not present

## 2014-12-25 ENCOUNTER — Ambulatory Visit: Payer: Medicare Other | Admitting: Podiatrist

## 2014-12-31 DIAGNOSIS — H8110 Benign paroxysmal vertigo, unspecified ear: Secondary | ICD-10-CM | POA: Diagnosis not present

## 2014-12-31 DIAGNOSIS — N182 Chronic kidney disease, stage 2 (mild): Secondary | ICD-10-CM | POA: Diagnosis not present

## 2014-12-31 DIAGNOSIS — E785 Hyperlipidemia, unspecified: Secondary | ICD-10-CM | POA: Diagnosis not present

## 2014-12-31 DIAGNOSIS — G609 Hereditary and idiopathic neuropathy, unspecified: Secondary | ICD-10-CM | POA: Diagnosis not present

## 2015-01-21 DIAGNOSIS — M9902 Segmental and somatic dysfunction of thoracic region: Secondary | ICD-10-CM | POA: Diagnosis not present

## 2015-01-21 DIAGNOSIS — M9903 Segmental and somatic dysfunction of lumbar region: Secondary | ICD-10-CM | POA: Diagnosis not present

## 2015-01-21 DIAGNOSIS — M9901 Segmental and somatic dysfunction of cervical region: Secondary | ICD-10-CM | POA: Diagnosis not present

## 2015-01-21 DIAGNOSIS — S29012A Strain of muscle and tendon of back wall of thorax, initial encounter: Secondary | ICD-10-CM | POA: Diagnosis not present

## 2015-01-21 DIAGNOSIS — S138XXA Sprain of joints and ligaments of other parts of neck, initial encounter: Secondary | ICD-10-CM | POA: Diagnosis not present

## 2015-01-21 DIAGNOSIS — M47817 Spondylosis without myelopathy or radiculopathy, lumbosacral region: Secondary | ICD-10-CM | POA: Diagnosis not present

## 2015-02-07 DIAGNOSIS — M7061 Trochanteric bursitis, right hip: Secondary | ICD-10-CM | POA: Diagnosis not present

## 2015-04-22 ENCOUNTER — Other Ambulatory Visit: Payer: Self-pay

## 2015-04-22 DIAGNOSIS — Z1231 Encounter for screening mammogram for malignant neoplasm of breast: Secondary | ICD-10-CM

## 2015-06-05 ENCOUNTER — Encounter: Payer: Self-pay | Admitting: Podiatry

## 2015-06-05 ENCOUNTER — Ambulatory Visit (INDEPENDENT_AMBULATORY_CARE_PROVIDER_SITE_OTHER): Payer: Medicare Other | Admitting: Podiatry

## 2015-06-05 DIAGNOSIS — L84 Corns and callosities: Secondary | ICD-10-CM | POA: Diagnosis not present

## 2015-06-06 NOTE — Progress Notes (Signed)
Subjective:     Patient ID: Norma Nicholson, female   DOB: Mar 29, 1946, 69 y.o.   MRN: [id number]  HPI patient presents with lesions underneath the left fifth metatarsal to continue to bother her   Review of Systems     Objective:   Physical Exam Neurovascular status intact with keratotic lesions noted on the plantar aspect left fifth metatarsal with 3 lesions noted    Assessment:     Callus formation plantar aspect left with porokeratotic type lesions    Plan:     Debride lesions on the left foot no iatrogenic bleeding and reappoint as needed

## 2015-06-09 ENCOUNTER — Ambulatory Visit
Admission: RE | Admit: 2015-06-09 | Discharge: 2015-06-09 | Disposition: A | Discharge status: Home / Self Care | Payer: Medicare Other | Source: Ambulatory Visit

## 2015-06-09 ENCOUNTER — Other Ambulatory Visit: Payer: Self-pay | Admitting: Internal Medicine

## 2015-06-09 DIAGNOSIS — Z1231 Encounter for screening mammogram for malignant neoplasm of breast: Secondary | ICD-10-CM

## 2015-06-09 DIAGNOSIS — R928 Other abnormal and inconclusive findings on diagnostic imaging of breast: Secondary | ICD-10-CM

## 2015-06-16 ENCOUNTER — Other Ambulatory Visit: Payer: Medicare Other

## 2015-06-19 ENCOUNTER — Ambulatory Visit
Admission: RE | Admit: 2015-06-19 | Discharge: 2015-06-19 | Disposition: A | Discharge status: Home / Self Care | Payer: Medicare Other | Source: Ambulatory Visit | Attending: Internal Medicine | Admitting: Internal Medicine

## 2015-06-19 DIAGNOSIS — R928 Other abnormal and inconclusive findings on diagnostic imaging of breast: Secondary | ICD-10-CM

## 2015-07-02 DIAGNOSIS — Z1389 Encounter for screening for other disorder: Secondary | ICD-10-CM | POA: Diagnosis not present

## 2015-07-02 DIAGNOSIS — E785 Hyperlipidemia, unspecified: Secondary | ICD-10-CM | POA: Diagnosis not present

## 2015-07-02 DIAGNOSIS — Z23 Encounter for immunization: Secondary | ICD-10-CM | POA: Diagnosis not present

## 2015-07-02 DIAGNOSIS — Z0000 Encounter for general adult medical examination without abnormal findings: Secondary | ICD-10-CM | POA: Diagnosis not present

## 2015-07-03 DIAGNOSIS — H2513 Age-related nuclear cataract, bilateral: Secondary | ICD-10-CM | POA: Diagnosis not present

## 2015-07-09 DIAGNOSIS — H8110 Benign paroxysmal vertigo, unspecified ear: Secondary | ICD-10-CM | POA: Diagnosis not present

## 2015-07-09 DIAGNOSIS — N182 Chronic kidney disease, stage 2 (mild): Secondary | ICD-10-CM | POA: Diagnosis not present

## 2015-07-09 DIAGNOSIS — Z23 Encounter for immunization: Secondary | ICD-10-CM | POA: Diagnosis not present

## 2015-07-09 DIAGNOSIS — G609 Hereditary and idiopathic neuropathy, unspecified: Secondary | ICD-10-CM | POA: Diagnosis not present

## 2015-07-09 DIAGNOSIS — E785 Hyperlipidemia, unspecified: Secondary | ICD-10-CM | POA: Diagnosis not present

## 2015-07-10 DIAGNOSIS — R194 Change in bowel habit: Secondary | ICD-10-CM | POA: Diagnosis not present

## 2015-07-10 DIAGNOSIS — R152 Fecal urgency: Secondary | ICD-10-CM | POA: Diagnosis not present

## 2015-07-10 DIAGNOSIS — K5730 Diverticulosis of large intestine without perforation or abscess without bleeding: Secondary | ICD-10-CM | POA: Diagnosis not present

## 2015-07-10 DIAGNOSIS — R159 Full incontinence of feces: Secondary | ICD-10-CM | POA: Diagnosis not present

## 2015-09-25 DIAGNOSIS — H6503 Acute serous otitis media, bilateral: Secondary | ICD-10-CM | POA: Diagnosis not present

## 2015-11-27 DIAGNOSIS — H52223 Regular astigmatism, bilateral: Secondary | ICD-10-CM | POA: Diagnosis not present

## 2015-11-27 DIAGNOSIS — H25813 Combined forms of age-related cataract, bilateral: Secondary | ICD-10-CM | POA: Diagnosis not present

## 2015-11-27 DIAGNOSIS — H5213 Myopia, bilateral: Secondary | ICD-10-CM | POA: Diagnosis not present

## 2015-11-27 DIAGNOSIS — H524 Presbyopia: Secondary | ICD-10-CM | POA: Diagnosis not present

## 2015-12-04 ENCOUNTER — Encounter: Payer: Self-pay | Admitting: Podiatry

## 2015-12-04 ENCOUNTER — Ambulatory Visit (INDEPENDENT_AMBULATORY_CARE_PROVIDER_SITE_OTHER): Payer: Medicare Other

## 2015-12-04 ENCOUNTER — Ambulatory Visit (INDEPENDENT_AMBULATORY_CARE_PROVIDER_SITE_OTHER): Payer: Medicare Other | Admitting: Podiatry

## 2015-12-04 VITALS — BP 132/88 | HR 73 | Resp 16

## 2015-12-04 DIAGNOSIS — Q828 Other specified congenital malformations of skin: Secondary | ICD-10-CM

## 2015-12-04 DIAGNOSIS — M79672 Pain in left foot: Secondary | ICD-10-CM | POA: Diagnosis not present

## 2015-12-04 DIAGNOSIS — M216X9 Other acquired deformities of unspecified foot: Secondary | ICD-10-CM

## 2015-12-04 DIAGNOSIS — M722 Plantar fascial fibromatosis: Secondary | ICD-10-CM

## 2015-12-04 MED ORDER — TRIAMCINOLONE ACETONIDE 10 MG/ML IJ SUSP
10.0000 mg | Freq: Once | INTRAMUSCULAR | Status: AC
  Administered 2015-12-04: 10 mg

## 2015-12-04 NOTE — Progress Notes (Signed)
Subjective:     Patient ID: Norma Nicholson, female   DOB: [birth year], 70 y.o.   MRN: [id number]  HPI patient presents with exquisite discomfort plantar aspect left heel and keratotic lesions plantar aspect of both feet that are painful with pressure   Review of Systems     Objective:   Physical Exam Neurovascular status intact muscle strength adequate with exquisite discomfort plantar heel left at the insertional point tendon into the calcaneus with fluid buildup and is also noted to have lesions underneath the metatarsals of both feet    Assessment:     Plantar fasciitis left acute in nature with keratotic lesion formation secondary to bone structure    Plan:     Inject the plantar fascial left 3 mg Kenalog 5 mg Xylocaine and debrided lesions bilateral with no iatrogenic bleeding noted

## 2015-12-04 NOTE — Patient Instructions (Signed)

## 2015-12-23 DIAGNOSIS — Z01419 Encounter for gynecological examination (general) (routine) without abnormal findings: Secondary | ICD-10-CM | POA: Diagnosis not present

## 2015-12-23 DIAGNOSIS — Z6825 Body mass index (BMI) 25.0-25.9, adult: Secondary | ICD-10-CM | POA: Diagnosis not present

## 2015-12-30 DIAGNOSIS — E785 Hyperlipidemia, unspecified: Secondary | ICD-10-CM | POA: Diagnosis not present

## 2016-01-06 DIAGNOSIS — G609 Hereditary and idiopathic neuropathy, unspecified: Secondary | ICD-10-CM | POA: Diagnosis not present

## 2016-01-06 DIAGNOSIS — N182 Chronic kidney disease, stage 2 (mild): Secondary | ICD-10-CM | POA: Diagnosis not present

## 2016-01-06 DIAGNOSIS — H8110 Benign paroxysmal vertigo, unspecified ear: Secondary | ICD-10-CM | POA: Diagnosis not present

## 2016-01-06 DIAGNOSIS — E785 Hyperlipidemia, unspecified: Secondary | ICD-10-CM | POA: Diagnosis not present

## 2016-01-21 ENCOUNTER — Ambulatory Visit (INDEPENDENT_AMBULATORY_CARE_PROVIDER_SITE_OTHER): Payer: Medicare Other | Admitting: Podiatry

## 2016-01-21 ENCOUNTER — Encounter: Payer: Self-pay | Admitting: Podiatry

## 2016-01-21 DIAGNOSIS — M779 Enthesopathy, unspecified: Secondary | ICD-10-CM | POA: Diagnosis not present

## 2016-01-21 DIAGNOSIS — L84 Corns and callosities: Secondary | ICD-10-CM

## 2016-01-21 MED ORDER — TRIAMCINOLONE ACETONIDE 10 MG/ML IJ SUSP
10.0000 mg | Freq: Once | INTRAMUSCULAR | Status: AC
  Administered 2016-01-21: 10 mg

## 2016-01-21 NOTE — Progress Notes (Signed)
Subjective:     Patient ID: Norma Nicholson, female   DOB: Jul 06, 1946, 70 y.o.   MRN: [id number]  HPI patient presents plantar aspect left foot stating it's been bothering her again   Review of Systems     Objective:   Physical Exam Neurovascular status unchanged with keratotic lesion plantar left and heel that's been bothering her and making shoe gear difficult    Assessment:     Chronic lesion forefoot left along with inflammatory changes plantar heel    Plan:     Debride lesion with no iatrogenic bleeding and injected 3 mg Kenalog 5 g Xylocaine to reduce inflammatory type capsular inflammation

## 2016-01-21 NOTE — Progress Notes (Signed)
   Subjective:    Patient ID: Norma Nicholson, female    DOB: 03-30-1946, 70 y.o.   MRN: [id number]  HPI "It's that callus on the left foot.  That thing has been giving me a time.  It's only been a month and a half since he last did it.  He must not have taken enough off."    Review of Systems     Objective:   Physical Exam        Assessment & Plan:

## 2016-02-23 ENCOUNTER — Ambulatory Visit (INDEPENDENT_AMBULATORY_CARE_PROVIDER_SITE_OTHER): Payer: Medicare Other | Admitting: Podiatry

## 2016-02-23 ENCOUNTER — Encounter: Payer: Self-pay | Admitting: Podiatry

## 2016-02-23 VITALS — BP 156/88 | HR 69 | Resp 12

## 2016-02-23 DIAGNOSIS — M722 Plantar fascial fibromatosis: Secondary | ICD-10-CM | POA: Diagnosis not present

## 2016-02-23 DIAGNOSIS — Q828 Other specified congenital malformations of skin: Secondary | ICD-10-CM

## 2016-02-23 MED ORDER — TRIAMCINOLONE ACETONIDE 10 MG/ML IJ SUSP
10.0000 mg | Freq: Once | INTRAMUSCULAR | Status: AC
  Administered 2016-02-23: 10 mg

## 2016-02-23 NOTE — Progress Notes (Signed)
Subjective:     Patient ID: Norma Nicholson, female   DOB: 09/27/1945, 70 y.o.   MRN: [id number]  HPI patient states I'm getting ready to go out of town for 2 months and my left heel has started to bother me a lot and the lesion on the outside of the foot is really sore   Review of Systems     Objective:   Physical Exam Neurovascular status intact muscle strength adequate with exquisite discomfort plantar aspect left heel at the insertional point of tendon into the calcaneus with pain also on a lesion the outside of the left foot around the fifth metatarsal head    Assessment:     Acute plantar fasciitis left with fluid buildup and keratotic lesion formation with pressure against the fifth metatarsal head left    Plan:     Reviewed condition and reinjected the plantar fascial left 3 mg Kenalog 5 mg Xylocaine. I then did full debridement of the lesion with no iatrogenic bleeding and patient will be seen back to recheck

## 2016-04-13 DIAGNOSIS — K5730 Diverticulosis of large intestine without perforation or abscess without bleeding: Secondary | ICD-10-CM | POA: Diagnosis not present

## 2016-04-13 DIAGNOSIS — R152 Fecal urgency: Secondary | ICD-10-CM | POA: Diagnosis not present

## 2016-04-13 DIAGNOSIS — R194 Change in bowel habit: Secondary | ICD-10-CM | POA: Diagnosis not present

## 2016-04-20 DIAGNOSIS — R159 Full incontinence of feces: Secondary | ICD-10-CM | POA: Diagnosis not present

## 2016-04-27 ENCOUNTER — Other Ambulatory Visit: Payer: Self-pay

## 2016-05-12 ENCOUNTER — Encounter: Payer: Self-pay | Admitting: Podiatry

## 2016-05-12 ENCOUNTER — Ambulatory Visit (INDEPENDENT_AMBULATORY_CARE_PROVIDER_SITE_OTHER): Payer: Medicare Other | Admitting: Podiatry

## 2016-05-12 VITALS — BP 116/71 | HR 67 | Resp 16

## 2016-05-12 DIAGNOSIS — Q828 Other specified congenital malformations of skin: Secondary | ICD-10-CM | POA: Diagnosis not present

## 2016-05-12 DIAGNOSIS — B351 Tinea unguium: Secondary | ICD-10-CM

## 2016-05-12 DIAGNOSIS — M779 Enthesopathy, unspecified: Secondary | ICD-10-CM | POA: Diagnosis not present

## 2016-05-12 MED ORDER — TRIAMCINOLONE ACETONIDE 10 MG/ML IJ SUSP
10.0000 mg | Freq: Once | INTRAMUSCULAR | Status: AC
  Administered 2016-05-12: 10 mg

## 2016-05-14 NOTE — Progress Notes (Signed)
Subjective:     Patient ID: Norma Nicholson, female   DOB: 12/08/1945, 70 y.o.   MRN: [id number]  HPI patient presents with painful lesion underneath the fifth metatarsal left with fluid buildup and also nail disease bilateral with thick yellow brittle nailbeds that can become tender at times and makes it hard to wear shoe gear comfortably   Review of Systems     Objective:   Physical Exam Neurovascular status intact muscle strength adequate with inflamed left fifth MPJ with fluid buildup around the metatarsal head with lesion formation and nail disease bilateral    Assessment:     Inflammatory capsulitis left fifth MPJ with keratotic tissue formation and nail disease    Plan:     H&P reviewed conditions discussed. At this point I injected the left fifth MPJ 3 mg dexamethasone Kenalog 5 mg Xylocaine and did full debridement of lesion and will be seen back to recheck

## 2016-06-07 ENCOUNTER — Other Ambulatory Visit: Payer: Self-pay | Admitting: Obstetrics & Gynecology

## 2016-06-07 DIAGNOSIS — Z1231 Encounter for screening mammogram for malignant neoplasm of breast: Secondary | ICD-10-CM

## 2016-06-23 DIAGNOSIS — Z0000 Encounter for general adult medical examination without abnormal findings: Secondary | ICD-10-CM | POA: Diagnosis not present

## 2016-06-23 DIAGNOSIS — E785 Hyperlipidemia, unspecified: Secondary | ICD-10-CM | POA: Diagnosis not present

## 2016-06-24 ENCOUNTER — Ambulatory Visit: Payer: Medicare Other

## 2016-06-30 DIAGNOSIS — E785 Hyperlipidemia, unspecified: Secondary | ICD-10-CM | POA: Diagnosis not present

## 2016-06-30 DIAGNOSIS — Z0000 Encounter for general adult medical examination without abnormal findings: Secondary | ICD-10-CM | POA: Diagnosis not present

## 2016-06-30 DIAGNOSIS — N182 Chronic kidney disease, stage 2 (mild): Secondary | ICD-10-CM | POA: Diagnosis not present

## 2016-07-05 ENCOUNTER — Other Ambulatory Visit: Payer: Self-pay | Admitting: Dermatology

## 2016-07-05 DIAGNOSIS — D239 Other benign neoplasm of skin, unspecified: Secondary | ICD-10-CM | POA: Diagnosis not present

## 2016-07-05 DIAGNOSIS — L570 Actinic keratosis: Secondary | ICD-10-CM | POA: Diagnosis not present

## 2016-07-05 DIAGNOSIS — L299 Pruritus, unspecified: Secondary | ICD-10-CM | POA: Diagnosis not present

## 2016-07-05 DIAGNOSIS — D492 Neoplasm of unspecified behavior of bone, soft tissue, and skin: Secondary | ICD-10-CM | POA: Diagnosis not present

## 2016-07-05 DIAGNOSIS — L821 Other seborrheic keratosis: Secondary | ICD-10-CM | POA: Diagnosis not present

## 2016-10-21 ENCOUNTER — Ambulatory Visit
Admission: RE | Admit: 2016-10-21 | Discharge: 2016-10-21 | Disposition: A | Discharge status: Home / Self Care | Payer: Medicare Other | Source: Ambulatory Visit | Attending: Obstetrics & Gynecology | Admitting: Obstetrics & Gynecology

## 2016-10-21 DIAGNOSIS — Z1231 Encounter for screening mammogram for malignant neoplasm of breast: Secondary | ICD-10-CM

## 2016-11-16 ENCOUNTER — Ambulatory Visit (INDEPENDENT_AMBULATORY_CARE_PROVIDER_SITE_OTHER): Payer: Medicare Other | Admitting: Podiatry

## 2016-11-16 ENCOUNTER — Encounter: Payer: Self-pay | Admitting: Podiatry

## 2016-11-16 DIAGNOSIS — L84 Corns and callosities: Secondary | ICD-10-CM

## 2016-11-16 DIAGNOSIS — M779 Enthesopathy, unspecified: Secondary | ICD-10-CM | POA: Diagnosis not present

## 2016-11-16 DIAGNOSIS — M722 Plantar fascial fibromatosis: Secondary | ICD-10-CM

## 2016-11-16 MED ORDER — TRIAMCINOLONE ACETONIDE 10 MG/ML IJ SUSP
10.0000 mg | Freq: Once | INTRAMUSCULAR | Status: AC
  Administered 2016-11-16: 10 mg

## 2016-11-17 NOTE — Progress Notes (Signed)
Subjective:     Patient ID: Norma Nicholson, female   DOB: [birth year], 71 y.o.   MRN: [id number]  HPI patient presents stating my heel has been bothering me a lot again and I am having a lot of problems with the outside of his left foot. Patient states there is fluid buildup and it's sore when pressed   Review of Systems     Objective:   Physical Exam Neurovascular status intact muscle strength adequate with patient found to have keratotic lesion sub-fifth metatarsal left with pain when palpated and also discomfort in the left plantar fascia the insertional point tendon into the calcaneus with depression of the arch noted.    Assessment:     Chronic fasciitis of the left heel which has recurred a fairly short time along with inflammatory callus and capsulitis fifth MPJ left    Plan:     Condition discussed at great length and at this point I worked with the pad or if this to make orthotics to offload the offending areas and I did inject the left plantar fashion 3 mg Kenalog 5 mill grams Xylocaine and into the sub-fifth capsular area plantarly with 3 mg dexamethasone Kenalog 5 mg Xylocaine. Debridement accomplished and orthotics will be dispensed

## 2016-12-08 DIAGNOSIS — H524 Presbyopia: Secondary | ICD-10-CM | POA: Diagnosis not present

## 2016-12-08 DIAGNOSIS — H25813 Combined forms of age-related cataract, bilateral: Secondary | ICD-10-CM | POA: Diagnosis not present

## 2016-12-09 ENCOUNTER — Other Ambulatory Visit: Payer: Medicare Other

## 2016-12-21 DIAGNOSIS — E785 Hyperlipidemia, unspecified: Secondary | ICD-10-CM | POA: Diagnosis not present

## 2016-12-23 DIAGNOSIS — N958 Other specified menopausal and perimenopausal disorders: Secondary | ICD-10-CM | POA: Diagnosis not present

## 2016-12-23 DIAGNOSIS — Z124 Encounter for screening for malignant neoplasm of cervix: Secondary | ICD-10-CM | POA: Diagnosis not present

## 2016-12-23 DIAGNOSIS — Z6829 Body mass index (BMI) 29.0-29.9, adult: Secondary | ICD-10-CM | POA: Diagnosis not present

## 2016-12-27 DIAGNOSIS — H2511 Age-related nuclear cataract, right eye: Secondary | ICD-10-CM | POA: Diagnosis not present

## 2016-12-28 DIAGNOSIS — N182 Chronic kidney disease, stage 2 (mild): Secondary | ICD-10-CM | POA: Diagnosis not present

## 2016-12-28 DIAGNOSIS — H8110 Benign paroxysmal vertigo, unspecified ear: Secondary | ICD-10-CM | POA: Diagnosis not present

## 2016-12-28 DIAGNOSIS — G609 Hereditary and idiopathic neuropathy, unspecified: Secondary | ICD-10-CM | POA: Diagnosis not present

## 2016-12-28 DIAGNOSIS — E785 Hyperlipidemia, unspecified: Secondary | ICD-10-CM | POA: Diagnosis not present

## 2016-12-29 ENCOUNTER — Other Ambulatory Visit: Payer: Self-pay | Admitting: Internal Medicine

## 2016-12-29 DIAGNOSIS — R42 Dizziness and giddiness: Secondary | ICD-10-CM

## 2016-12-29 NOTE — Progress Notes (Signed)
Cardiology Office Note   Date:  12/31/2016   ID:  Norma Nicholson, DOB [birth year], MRN [id number]  PCP:  Thressa Sheller, MD  Cardiologist:   Minus Breeding, MD    Chief Complaint  Patient presents with  . Palpitations      History of Present Illness: Norma Nicholson is a 71 y.o. female who presents for evaluation of evaluation of palpitations. She has a family history of early coronary disease. Her dad died suddenly at age 76. Sounds like he had a massive MI. Her mother had heart failure. She's not had any cardiovascular testing in the past. However, she's noticed her heart skipping when she's taken her pulse or blood pressure. She'll feel skipping and her pulse but she doesn't really feeling in her chest. She's not had any presyncope or syncope. She has no chest pressure, neck or arm discomfort. She does have some mild orthostasis at times. She also has some wobbling gait she walks which she's been told she has vertigo. She does do some exercising at church and she doesn't bring any symptoms with this. She's not had any PND or orthopnea.  She will get short of breath climbing the stairs which is slightly progressive.    Past Medical History:  Diagnosis Date  . Benign paroxysmal vertigo   . Chronic kidney disease, stage 2 (mild)   . Collagenous colitis   . Hyperlipidemia   . Restless leg syndrome     Past Surgical History:  Procedure Laterality Date  . BUNIONECTOMY  2000  . FINGER SURGERY       Current Outpatient Prescriptions  Medication Sig Dispense Refill  . simvastatin (ZOCOR) 20 MG tablet      No current facility-administered medications for this visit.     Allergies:   Penicillins    Social History:  The patient  reports that she has quit smoking. She has never used smokeless tobacco. She reports that she drinks alcohol. She reports that she does not use drugs.   Family History:  The patient's family history includes Breast cancer in her sister; Congestive Heart  Failure in her mother; Diabetes in her mother; Heart attack in her father.    ROS:  Please see the history of present illness.   Otherwise, review of systems are positive for none.   All other systems are reviewed and negative.    PHYSICAL EXAM: VS:  BP 132/76 (BP Location: Right Arm, Patient Position: Sitting, Cuff Size: Normal)   Pulse 86   Ht 5\' 5"  (1.651 m)   Wt 174 lb (78.9 kg)   BMI 28.96 kg/m  , BMI Body mass index is 28.96 kg/m. GENERAL:  Well appearing HEENT:  Pupils equal round and reactive, fundi not visualized, oral mucosa unremarkable NECK:  No jugular venous distention, waveform within normal limits, carotid upstroke brisk and symmetric, no bruits, no thyromegaly LYMPHATICS:  No cervical, inguinal adenopathy LUNGS:  Clear to auscultation bilaterally BACK:  No CVA tenderness CHEST:  Unremarkable HEART:  PMI not displaced or sustained,S1 and S2 within normal limits, no S3, no S4, no clicks, no rubs, no murmurs ABD:  Flat, positive bowel sounds normal in frequency in pitch, no bruits, no rebound, no guarding, no midline pulsatile mass, no hepatomegaly, no splenomegaly EXT:  2 plus pulses throughout, no edema, no cyanosis no clubbing SKIN:  No rashes no nodules NEURO:  Cranial nerves II through XII grossly intact, motor grossly intact throughout PSYCH:  Cognitively intact, oriented to person place and time  EKG:  EKG is ordered today. The ekg ordered today demonstrates sinus rhythm, rate 86, premature atrial contractions, no acute ST-T wave changes.  12/31/2016    Recent Labs: No results found for requested labs within last 8760 hours.    Lipid Panel No results found for: CHOL, TRIG, HDL, CHOLHDL, VLDL, LDLCALC, LDLDIRECT    Wt Readings from Last 3 Encounters:  12/30/16 174 lb (78.9 kg)      Other studies Reviewed: Additional studies/ records that were reviewed today include: Office records. Review of the above records demonstrates:  Please see elsewhere  in the note.     ASSESSMENT AND PLAN:  SOB:   I will bring the patient back for a POET (Plain Old Exercise Test). This will allow me to screen for obstructive coronary disease, risk stratify and very importantly provide a prescription for exercise.  PALPITATIONS:  I will order 24 hour Holter.     Current medicines are reviewed at length with the patient today.  The patient does not have concerns regarding medicines.  The following changes have been made:  no change  Labs/ tests ordered today include:   Orders Placed This Encounter  Procedures  . EXERCISE TOLERANCE TEST  . Holter monitor - 24 hour  . EKG 12-Lead     Disposition:   FU with me as needed based on the above.     Signed, Minus Breeding, MD  12/31/2016 1:32 PM    [facility]

## 2016-12-30 ENCOUNTER — Encounter: Payer: Self-pay | Admitting: Cardiology

## 2016-12-30 ENCOUNTER — Ambulatory Visit (INDEPENDENT_AMBULATORY_CARE_PROVIDER_SITE_OTHER): Payer: Medicare Other | Admitting: Cardiology

## 2016-12-30 VITALS — BP 132/76 | HR 86 | Ht 65.0 in | Wt 174.0 lb

## 2016-12-30 DIAGNOSIS — R0602 Shortness of breath: Secondary | ICD-10-CM | POA: Diagnosis not present

## 2016-12-30 DIAGNOSIS — R002 Palpitations: Secondary | ICD-10-CM

## 2016-12-30 NOTE — Patient Instructions (Signed)
Medication Instructions:  Continue current medications  Labwork: None Ordered  Testing/Procedures: Your physician has requested that you have an exercise tolerance test. For further information please visit [email or web address]. Please also follow instruction sheet, as given.  Your physician has recommended that you wear a 24 hour holter monitor. Holter monitors are medical devices that record the heart's electrical activity. Doctors most often use these monitors to diagnose arrhythmias. Arrhythmias are problems with the speed or rhythm of the heartbeat. The monitor is a small, portable device. You can wear one while you do your normal daily activities. This is usually used to diagnose what is causing palpitations/syncope (passing out).  Follow-Up: Your physician recommends that you schedule a follow-up appointment in: As Needed   Any Other Special Instructions Will Be Listed Below (If Applicable).   If you need a refill on your cardiac medications before your next appointment, please call your pharmacy.

## 2016-12-31 ENCOUNTER — Encounter: Payer: Self-pay | Admitting: Cardiology

## 2016-12-31 DIAGNOSIS — R002 Palpitations: Secondary | ICD-10-CM | POA: Insufficient documentation

## 2016-12-31 DIAGNOSIS — R0602 Shortness of breath: Secondary | ICD-10-CM | POA: Insufficient documentation

## 2017-01-05 ENCOUNTER — Ambulatory Visit
Admission: RE | Admit: 2017-01-05 | Discharge: 2017-01-05 | Disposition: A | Discharge status: Home / Self Care | Payer: Medicare Other | Source: Ambulatory Visit | Attending: Internal Medicine | Admitting: Internal Medicine

## 2017-01-05 DIAGNOSIS — R42 Dizziness and giddiness: Secondary | ICD-10-CM

## 2017-01-05 DIAGNOSIS — I6523 Occlusion and stenosis of bilateral carotid arteries: Secondary | ICD-10-CM | POA: Diagnosis not present

## 2017-01-10 ENCOUNTER — Ambulatory Visit (INDEPENDENT_AMBULATORY_CARE_PROVIDER_SITE_OTHER): Payer: Medicare Other

## 2017-01-10 DIAGNOSIS — R002 Palpitations: Secondary | ICD-10-CM

## 2017-01-12 ENCOUNTER — Telehealth (HOSPITAL_COMMUNITY): Payer: Self-pay

## 2017-01-12 ENCOUNTER — Telehealth: Payer: Self-pay | Admitting: Cardiology

## 2017-01-12 NOTE — Telephone Encounter (Signed)
Pt returned your call please give her a call back.

## 2017-01-12 NOTE — Telephone Encounter (Signed)
Encounter complete. 

## 2017-01-12 NOTE — Telephone Encounter (Signed)
Message routed to A. Unk Lightning, RN in stress test department who LM for patient earlier this afternoon

## 2017-01-13 ENCOUNTER — Telehealth (HOSPITAL_COMMUNITY): Payer: Self-pay

## 2017-01-13 NOTE — Telephone Encounter (Signed)
Encounter complete. 

## 2017-01-14 ENCOUNTER — Ambulatory Visit (HOSPITAL_COMMUNITY)
Admission: RE | Admit: 2017-01-14 | Discharge: 2017-01-14 | Disposition: A | Discharge status: Home / Self Care | Payer: Medicare Other | Source: Ambulatory Visit | Attending: Cardiovascular Disease | Admitting: Cardiovascular Disease

## 2017-01-14 DIAGNOSIS — R0602 Shortness of breath: Secondary | ICD-10-CM | POA: Insufficient documentation

## 2017-01-14 LAB — EXERCISE TOLERANCE TEST
CHL RATE OF PERCEIVED EXERTION: 16
CSEPHR: 97 %
Estimated workload: 8.6 METS
Exercise duration (min): 7 min
MPHR: 147 {beats}/min
Peak HR: 144 {beats}/min
Rest HR: 79 {beats}/min

## 2017-01-18 ENCOUNTER — Telehealth: Payer: Self-pay | Admitting: *Deleted

## 2017-01-18 NOTE — Telephone Encounter (Signed)
Left message to call back:  Notes recorded by Minus Breeding, MD on 01/15/2017 at 1:37 PM EDT No evidence of ischemia. Call Ms. Lorensen with the results and send results to Thressa Sheller, MD

## 2017-01-24 DIAGNOSIS — H2511 Age-related nuclear cataract, right eye: Secondary | ICD-10-CM | POA: Diagnosis not present

## 2017-01-24 DIAGNOSIS — H25811 Combined forms of age-related cataract, right eye: Secondary | ICD-10-CM | POA: Diagnosis not present

## 2017-01-31 DIAGNOSIS — H2512 Age-related nuclear cataract, left eye: Secondary | ICD-10-CM | POA: Diagnosis not present

## 2017-02-07 DIAGNOSIS — H2512 Age-related nuclear cataract, left eye: Secondary | ICD-10-CM | POA: Diagnosis not present

## 2017-02-07 DIAGNOSIS — H25812 Combined forms of age-related cataract, left eye: Secondary | ICD-10-CM | POA: Diagnosis not present

## 2017-04-26 DIAGNOSIS — H04123 Dry eye syndrome of bilateral lacrimal glands: Secondary | ICD-10-CM | POA: Diagnosis not present

## 2017-04-26 DIAGNOSIS — H43811 Vitreous degeneration, right eye: Secondary | ICD-10-CM | POA: Diagnosis not present

## 2017-04-26 DIAGNOSIS — H10413 Chronic giant papillary conjunctivitis, bilateral: Secondary | ICD-10-CM | POA: Diagnosis not present

## 2017-05-06 DIAGNOSIS — F4321 Adjustment disorder with depressed mood: Secondary | ICD-10-CM | POA: Diagnosis not present

## 2017-05-06 DIAGNOSIS — F419 Anxiety disorder, unspecified: Secondary | ICD-10-CM | POA: Diagnosis not present

## 2017-05-06 DIAGNOSIS — Z23 Encounter for immunization: Secondary | ICD-10-CM | POA: Diagnosis not present

## 2017-06-10 DIAGNOSIS — F411 Generalized anxiety disorder: Secondary | ICD-10-CM | POA: Diagnosis not present

## 2017-06-10 DIAGNOSIS — E782 Mixed hyperlipidemia: Secondary | ICD-10-CM | POA: Diagnosis not present

## 2017-06-10 DIAGNOSIS — G4700 Insomnia, unspecified: Secondary | ICD-10-CM | POA: Diagnosis not present

## 2017-06-10 DIAGNOSIS — E559 Vitamin D deficiency, unspecified: Secondary | ICD-10-CM | POA: Diagnosis not present

## 2017-06-10 DIAGNOSIS — Z79899 Other long term (current) drug therapy: Secondary | ICD-10-CM | POA: Diagnosis not present

## 2017-06-10 DIAGNOSIS — Z6829 Body mass index (BMI) 29.0-29.9, adult: Secondary | ICD-10-CM | POA: Diagnosis not present

## 2017-06-10 DIAGNOSIS — H6123 Impacted cerumen, bilateral: Secondary | ICD-10-CM | POA: Diagnosis not present

## 2017-06-15 ENCOUNTER — Encounter: Payer: Self-pay | Admitting: Podiatry

## 2017-06-15 ENCOUNTER — Ambulatory Visit (INDEPENDENT_AMBULATORY_CARE_PROVIDER_SITE_OTHER): Payer: Medicare Other | Admitting: Podiatry

## 2017-06-15 DIAGNOSIS — M722 Plantar fascial fibromatosis: Secondary | ICD-10-CM

## 2017-06-15 DIAGNOSIS — Q828 Other specified congenital malformations of skin: Secondary | ICD-10-CM

## 2017-06-15 DIAGNOSIS — M779 Enthesopathy, unspecified: Secondary | ICD-10-CM | POA: Diagnosis not present

## 2017-06-15 MED ORDER — TRIAMCINOLONE ACETONIDE 10 MG/ML IJ SUSP
10.0000 mg | Freq: Once | INTRAMUSCULAR | Status: AC
  Administered 2017-06-15: 10 mg

## 2017-06-17 NOTE — Progress Notes (Signed)
Subjective:    Patient ID: Norma Nicholson, female   DOB: 71 y.o.   MRN: [id number]   HPI patient presents with heel discomfort inflammation of the first metatarsal joint and callus formations that become painful bilateral    ROS      Objective:  Physical Exam neurovascular status intact with inflammation around the first MPJ left with fluid buildup and pain in the left heel and my right heel with moderate nature with plantar callus formation     Assessment:    Plantar fasciitis inflammatory capsulitis and porokeratotic formation     Plan:    Reviewed all problems and today injected the capsule 3 mg Kenalog 5 mg Xylocaine and debrided lesions with no iatrogenic bleeding and discussed continued shoe gear modifications physical therapy for plantar fasciitis

## 2017-07-04 DIAGNOSIS — J309 Allergic rhinitis, unspecified: Secondary | ICD-10-CM | POA: Diagnosis not present

## 2017-07-04 DIAGNOSIS — E782 Mixed hyperlipidemia: Secondary | ICD-10-CM | POA: Diagnosis not present

## 2017-07-04 DIAGNOSIS — H6123 Impacted cerumen, bilateral: Secondary | ICD-10-CM | POA: Diagnosis not present

## 2017-07-04 DIAGNOSIS — R42 Dizziness and giddiness: Secondary | ICD-10-CM | POA: Diagnosis not present

## 2017-07-04 DIAGNOSIS — E559 Vitamin D deficiency, unspecified: Secondary | ICD-10-CM | POA: Diagnosis not present

## 2017-08-10 ENCOUNTER — Ambulatory Visit (INDEPENDENT_AMBULATORY_CARE_PROVIDER_SITE_OTHER): Payer: Medicare Other | Admitting: Podiatry

## 2017-08-10 ENCOUNTER — Encounter: Payer: Self-pay | Admitting: Podiatry

## 2017-08-10 DIAGNOSIS — Q828 Other specified congenital malformations of skin: Secondary | ICD-10-CM

## 2017-08-10 DIAGNOSIS — M779 Enthesopathy, unspecified: Secondary | ICD-10-CM | POA: Diagnosis not present

## 2017-08-10 NOTE — Progress Notes (Signed)
Subjective:   Patient ID: Norma Nicholson, female   DOB: 71 y.o.   MRN: [id number]   HPI Patient presents stating still having some pain in the outside of the left foot with moderate improvement but I am concerned I might need surgery someday   ROS      Objective:  Physical Exam  Neurovascular status intact with patient found to have inflammation around the fifth metatarsal head left with moderate prominence of the bone and keratotic lesion bilateral which were moderately painful when pressed     Assessment:  Chronic lesion formation with plantarflexed metatarsal with keratotic tissue formation sub-fifth metatarsal left and third and first metatarsal left     Plan:  Reviewed all conditions debridement accomplished today and discussed if symptoms come back in a fast pattern but ultimately this may require fifth metatarsal head resection.  Explained the procedure to patient will get a hold off currently but it may be necessary we will see response to debridement

## 2017-08-12 ENCOUNTER — Ambulatory Visit: Payer: Medicare Other | Admitting: Podiatry

## 2017-09-13 DIAGNOSIS — N390 Urinary tract infection, site not specified: Secondary | ICD-10-CM | POA: Diagnosis not present

## 2017-09-13 DIAGNOSIS — Z6829 Body mass index (BMI) 29.0-29.9, adult: Secondary | ICD-10-CM | POA: Diagnosis not present

## 2017-09-20 ENCOUNTER — Other Ambulatory Visit: Payer: Self-pay | Admitting: Obstetrics & Gynecology

## 2017-09-20 DIAGNOSIS — Z1231 Encounter for screening mammogram for malignant neoplasm of breast: Secondary | ICD-10-CM

## 2017-10-03 DIAGNOSIS — J309 Allergic rhinitis, unspecified: Secondary | ICD-10-CM | POA: Diagnosis not present

## 2017-10-03 DIAGNOSIS — E782 Mixed hyperlipidemia: Secondary | ICD-10-CM | POA: Diagnosis not present

## 2017-10-03 DIAGNOSIS — M549 Dorsalgia, unspecified: Secondary | ICD-10-CM | POA: Diagnosis not present

## 2017-10-03 DIAGNOSIS — Z6822 Body mass index (BMI) 22.0-22.9, adult: Secondary | ICD-10-CM | POA: Diagnosis not present

## 2017-10-24 ENCOUNTER — Ambulatory Visit
Admission: RE | Admit: 2017-10-24 | Discharge: 2017-10-24 | Disposition: A | Discharge status: Home / Self Care | Payer: Medicare Other | Source: Ambulatory Visit | Attending: Obstetrics & Gynecology | Admitting: Obstetrics & Gynecology

## 2017-10-24 DIAGNOSIS — Z1231 Encounter for screening mammogram for malignant neoplasm of breast: Secondary | ICD-10-CM

## 2017-10-25 DIAGNOSIS — H43811 Vitreous degeneration, right eye: Secondary | ICD-10-CM | POA: Diagnosis not present

## 2017-10-25 DIAGNOSIS — H26493 Other secondary cataract, bilateral: Secondary | ICD-10-CM | POA: Diagnosis not present

## 2017-10-25 DIAGNOSIS — H04123 Dry eye syndrome of bilateral lacrimal glands: Secondary | ICD-10-CM | POA: Diagnosis not present

## 2017-10-25 DIAGNOSIS — Z961 Presence of intraocular lens: Secondary | ICD-10-CM | POA: Diagnosis not present

## 2017-10-25 DIAGNOSIS — H40013 Open angle with borderline findings, low risk, bilateral: Secondary | ICD-10-CM | POA: Diagnosis not present

## 2017-11-23 DIAGNOSIS — E559 Vitamin D deficiency, unspecified: Secondary | ICD-10-CM | POA: Diagnosis not present

## 2017-11-23 DIAGNOSIS — Z79899 Other long term (current) drug therapy: Secondary | ICD-10-CM | POA: Diagnosis not present

## 2017-11-23 DIAGNOSIS — E782 Mixed hyperlipidemia: Secondary | ICD-10-CM | POA: Diagnosis not present

## 2017-11-25 DIAGNOSIS — M549 Dorsalgia, unspecified: Secondary | ICD-10-CM | POA: Diagnosis not present

## 2017-11-25 DIAGNOSIS — Z6829 Body mass index (BMI) 29.0-29.9, adult: Secondary | ICD-10-CM | POA: Diagnosis not present

## 2017-11-25 DIAGNOSIS — E782 Mixed hyperlipidemia: Secondary | ICD-10-CM | POA: Diagnosis not present

## 2017-11-25 DIAGNOSIS — E559 Vitamin D deficiency, unspecified: Secondary | ICD-10-CM | POA: Diagnosis not present

## 2017-12-26 DIAGNOSIS — Z01419 Encounter for gynecological examination (general) (routine) without abnormal findings: Secondary | ICD-10-CM | POA: Diagnosis not present

## 2017-12-26 DIAGNOSIS — Z6829 Body mass index (BMI) 29.0-29.9, adult: Secondary | ICD-10-CM | POA: Diagnosis not present

## 2018-02-09 DIAGNOSIS — Z1211 Encounter for screening for malignant neoplasm of colon: Secondary | ICD-10-CM | POA: Diagnosis not present

## 2018-02-09 DIAGNOSIS — Z23 Encounter for immunization: Secondary | ICD-10-CM | POA: Diagnosis not present

## 2018-02-09 DIAGNOSIS — Z6829 Body mass index (BMI) 29.0-29.9, adult: Secondary | ICD-10-CM | POA: Diagnosis not present

## 2018-02-09 DIAGNOSIS — Z0000 Encounter for general adult medical examination without abnormal findings: Secondary | ICD-10-CM | POA: Diagnosis not present

## 2018-03-09 DIAGNOSIS — Z23 Encounter for immunization: Secondary | ICD-10-CM | POA: Diagnosis not present

## 2018-04-14 DIAGNOSIS — Z23 Encounter for immunization: Secondary | ICD-10-CM | POA: Diagnosis not present

## 2018-04-28 DIAGNOSIS — H524 Presbyopia: Secondary | ICD-10-CM | POA: Diagnosis not present

## 2018-04-28 DIAGNOSIS — H26493 Other secondary cataract, bilateral: Secondary | ICD-10-CM | POA: Diagnosis not present

## 2018-04-28 DIAGNOSIS — H43811 Vitreous degeneration, right eye: Secondary | ICD-10-CM | POA: Diagnosis not present

## 2018-04-28 DIAGNOSIS — Z961 Presence of intraocular lens: Secondary | ICD-10-CM | POA: Diagnosis not present

## 2018-04-28 DIAGNOSIS — H40013 Open angle with borderline findings, low risk, bilateral: Secondary | ICD-10-CM | POA: Diagnosis not present

## 2018-05-22 DIAGNOSIS — G4700 Insomnia, unspecified: Secondary | ICD-10-CM | POA: Diagnosis not present

## 2018-05-22 DIAGNOSIS — H9319 Tinnitus, unspecified ear: Secondary | ICD-10-CM | POA: Diagnosis not present

## 2018-05-22 DIAGNOSIS — F411 Generalized anxiety disorder: Secondary | ICD-10-CM | POA: Diagnosis not present

## 2018-05-26 DIAGNOSIS — F411 Generalized anxiety disorder: Secondary | ICD-10-CM | POA: Diagnosis not present

## 2018-05-26 DIAGNOSIS — G4700 Insomnia, unspecified: Secondary | ICD-10-CM | POA: Diagnosis not present

## 2018-06-19 DIAGNOSIS — Z6829 Body mass index (BMI) 29.0-29.9, adult: Secondary | ICD-10-CM | POA: Diagnosis not present

## 2018-06-19 DIAGNOSIS — H6123 Impacted cerumen, bilateral: Secondary | ICD-10-CM | POA: Diagnosis not present

## 2018-06-19 DIAGNOSIS — F411 Generalized anxiety disorder: Secondary | ICD-10-CM | POA: Diagnosis not present

## 2018-06-19 DIAGNOSIS — H9319 Tinnitus, unspecified ear: Secondary | ICD-10-CM | POA: Diagnosis not present

## 2018-06-30 IMAGING — US US CAROTID DUPLEX BILAT
1 series · 13 of 24 positions shown · non-contrast
Comparison: None.

CLINICAL DATA: Dizziness, hypertension, hyperlipidemia.

EXAM:
BILATERAL CAROTID DUPLEX ULTRASOUND
TECHNIQUE: Gray scale imaging, color Doppler and duplex ultrasound was
performed of bilateral carotid and vertebral arteries in the neck.
TECHNIQUE: Quantification of carotid stenosis is based on velocity parameters
that correlate the residual internal carotid diameter with
NASCET-based stenosis levels, using the diameter of the distal
internal carotid lumen as the denominator for stenosis measurement.

[Series 1: us carotid duplex bilat · 0.06mm/px · 13 of 72 slices shown]
[im 1/72]
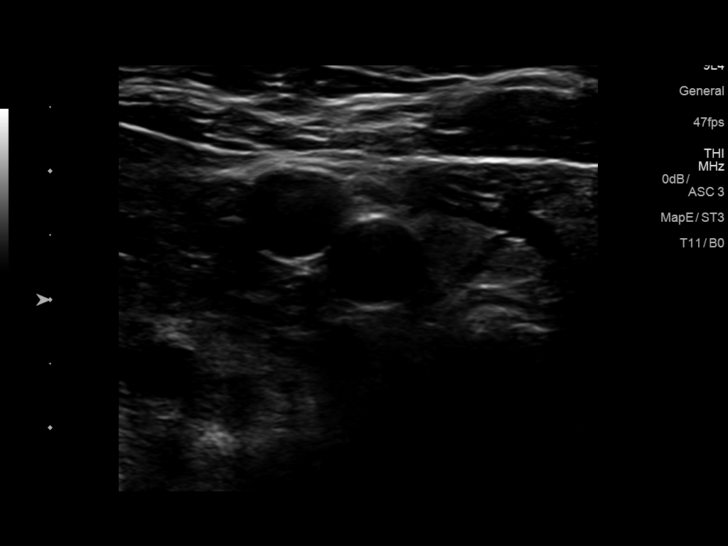
[im 7/72]
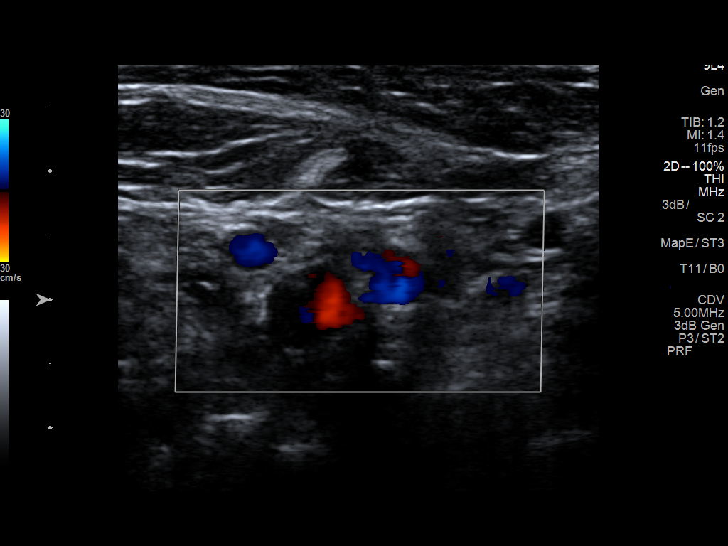
[im 13/72]
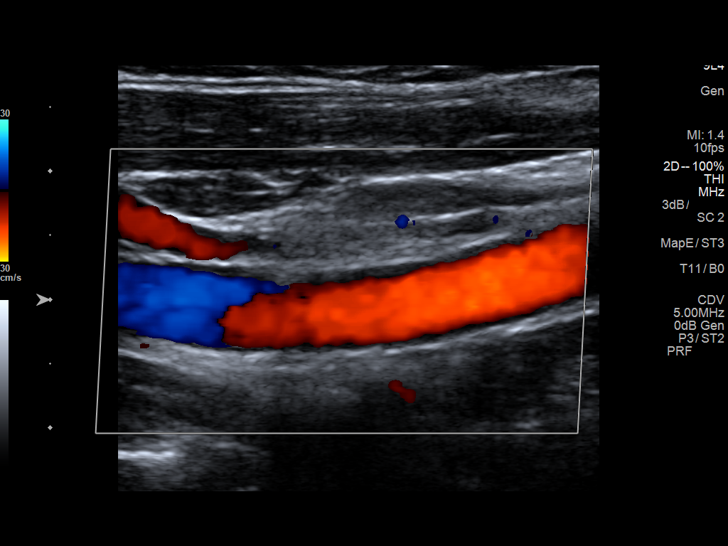
[im 19/72]
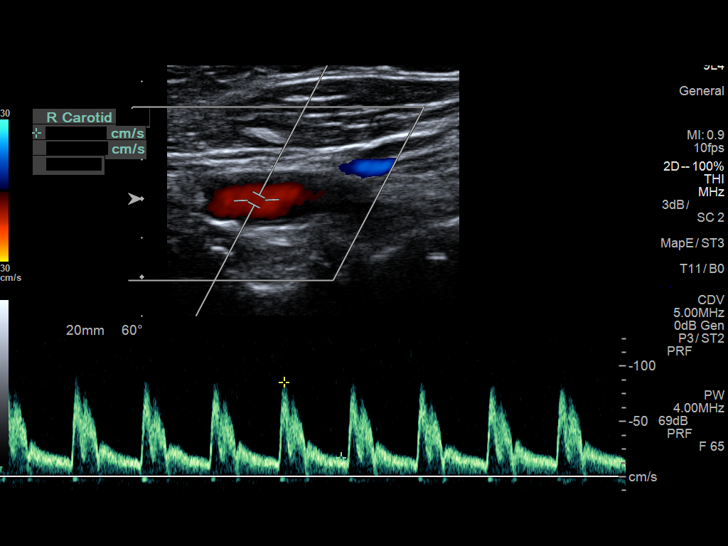
[im 25/72]
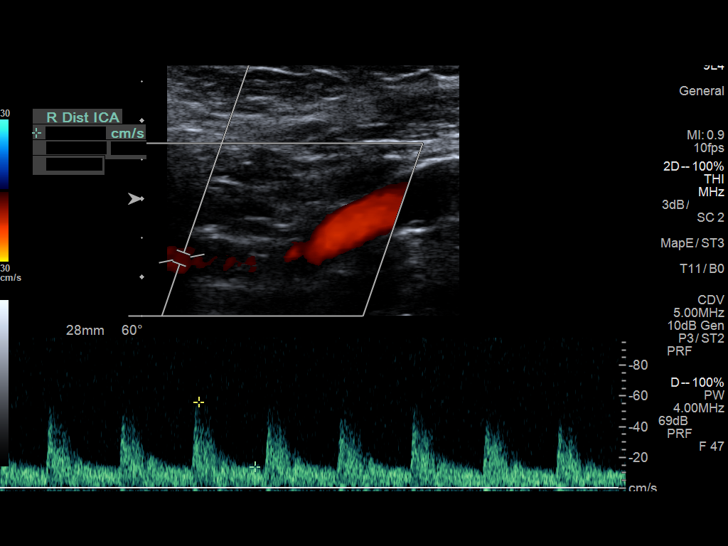
[im 31/72]
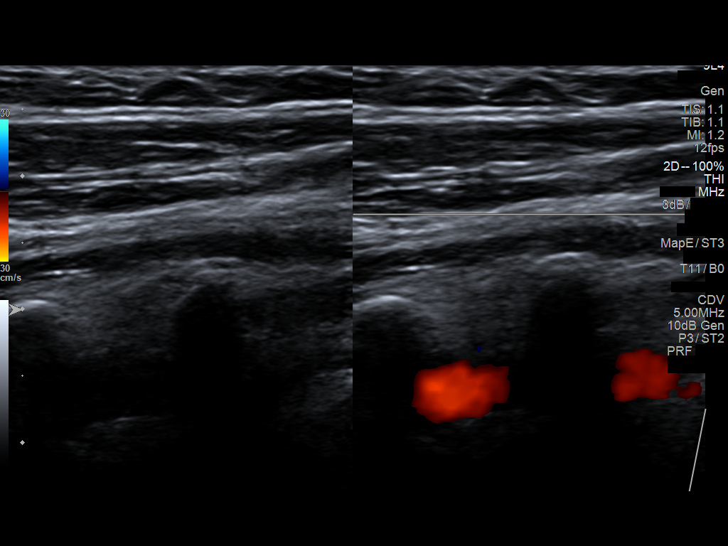
[im 38/72]
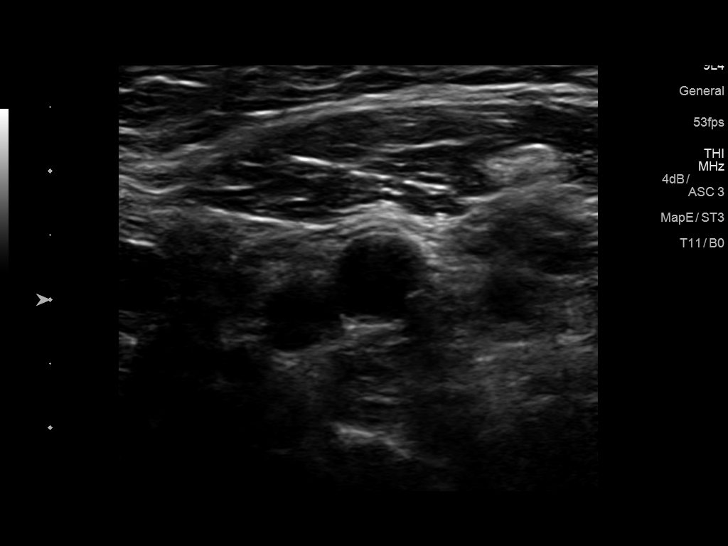
[im 41/72]
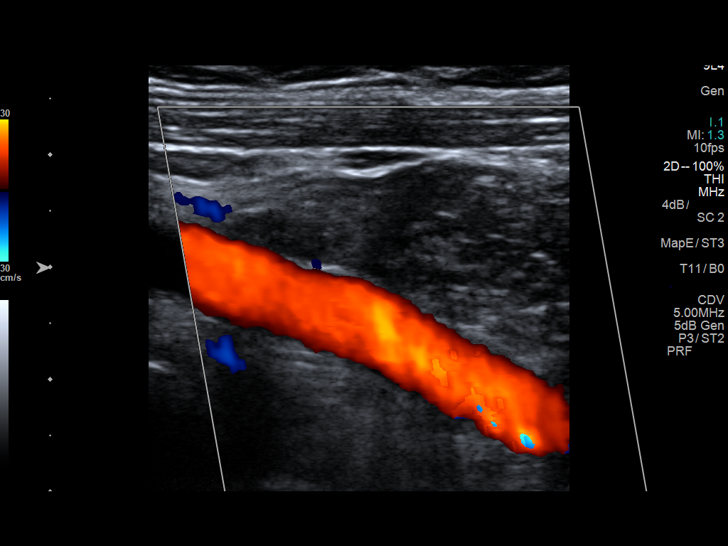
[im 47/72]
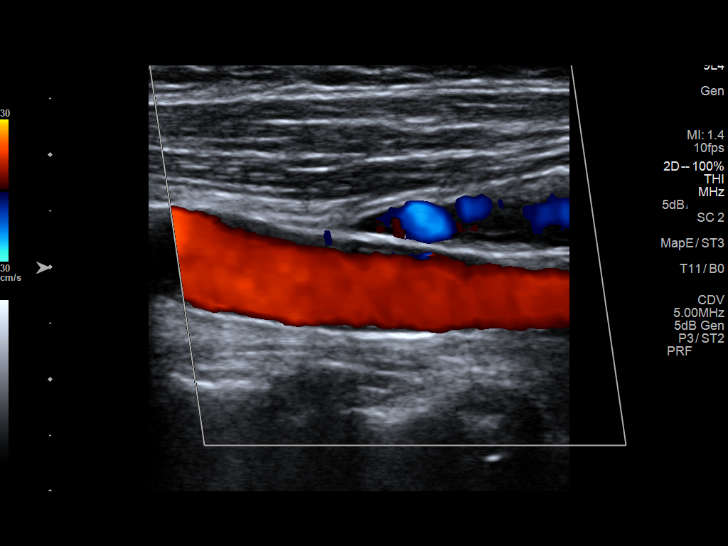
[im 53/72]
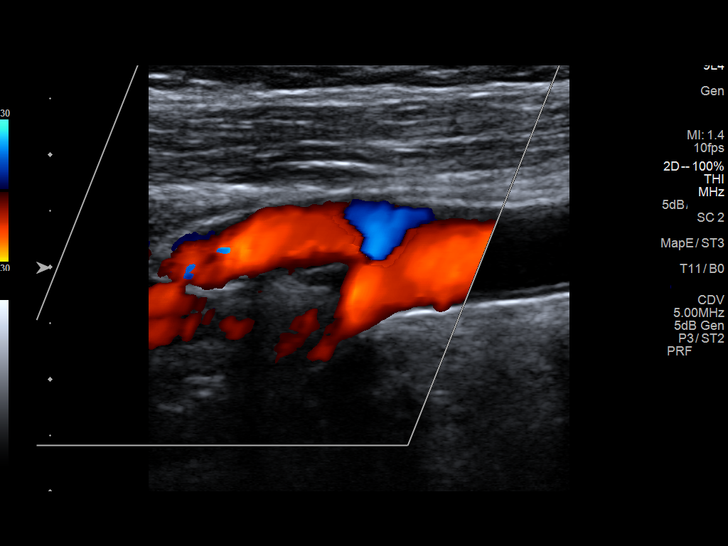
[im 59/72]
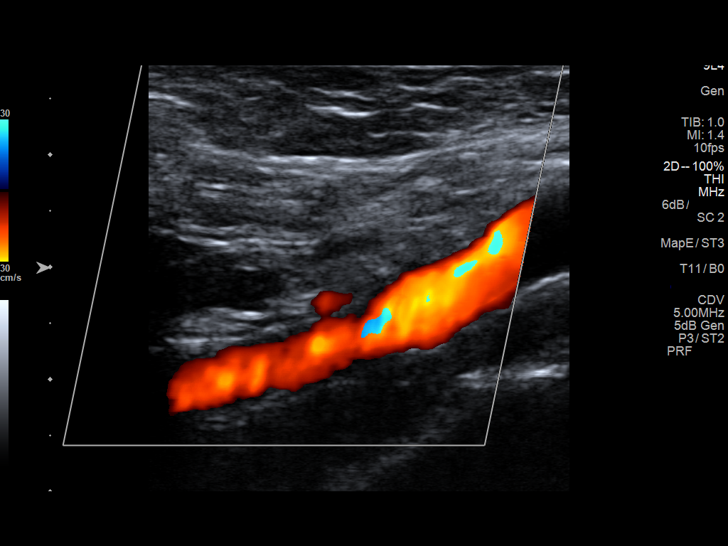
[im 65/72]
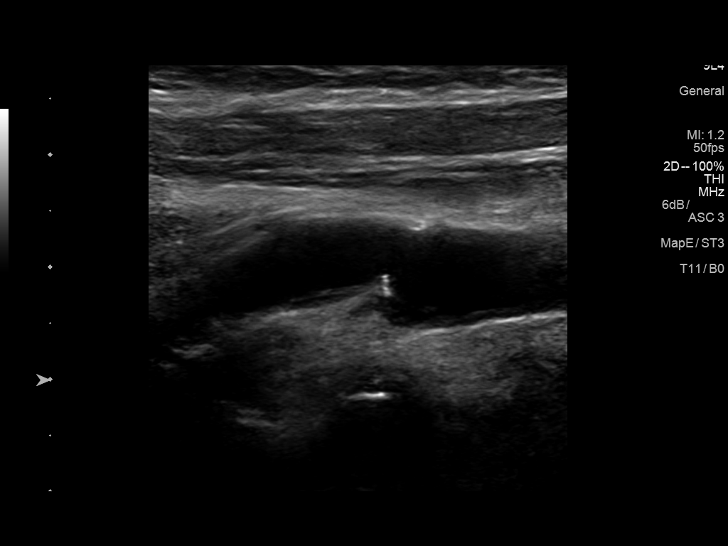
[im 72/72]
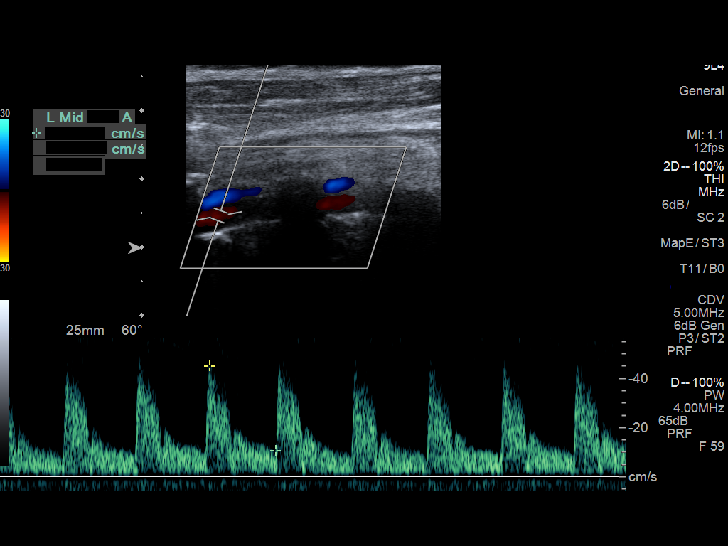

[13 of 24 positions shown; findings below may reference images not displayed]

The following velocity measurements were obtained:

PEAK SYSTOLIC/END DIASTOLIC

RIGHT

ICA:                     98/24cm/sec

CCA:                     130/16cm/sec

SYSTOLIC ICA/CCA RATIO:

DIASTOLIC ICA/CCA RATIO:

ECA:                     85/9cm/sec

LEFT

ICA:                     88/25cm/sec

CCA:                     122/21cm/sec

SYSTOLIC ICA/CCA RATIO:

DIASTOLIC ICA/CCA RATIO:

ECA:                     73/9cm/sec
FINDINGS: RIGHT CAROTID ARTERY: Mild plaque at the origin of the ICA. No
high-grade stenosis. Normal waveforms and color Doppler signal.

RIGHT VERTEBRAL ARTERY:  Normal flow direction and waveform.

LEFT CAROTID ARTERY: Mild plaque in the carotid bulb and proximal
ICA. No high-grade stenosis. Normal waveforms and color Doppler
signal.

LEFT VERTEBRAL ARTERY: Normal flow direction and waveform.
IMPRESSION: 1. Mild bilateral proximal ICA plaque resulting in less than 50%
diameter stenosis.
2.  Antegrade bilateral vertebral arterial flow.

## 2018-07-11 ENCOUNTER — Other Ambulatory Visit: Payer: Self-pay

## 2018-09-26 ENCOUNTER — Other Ambulatory Visit: Payer: Self-pay | Admitting: Obstetrics & Gynecology

## 2018-09-26 DIAGNOSIS — Z1231 Encounter for screening mammogram for malignant neoplasm of breast: Secondary | ICD-10-CM

## 2018-10-05 ENCOUNTER — Other Ambulatory Visit: Payer: Self-pay | Admitting: Podiatry

## 2018-10-05 ENCOUNTER — Ambulatory Visit (INDEPENDENT_AMBULATORY_CARE_PROVIDER_SITE_OTHER): Payer: Medicare Other | Admitting: Podiatry

## 2018-10-05 ENCOUNTER — Ambulatory Visit (INDEPENDENT_AMBULATORY_CARE_PROVIDER_SITE_OTHER): Payer: Medicare Other

## 2018-10-05 ENCOUNTER — Encounter: Payer: Self-pay | Admitting: Podiatry

## 2018-10-05 DIAGNOSIS — L84 Corns and callosities: Secondary | ICD-10-CM

## 2018-10-05 DIAGNOSIS — M79672 Pain in left foot: Secondary | ICD-10-CM

## 2018-10-05 DIAGNOSIS — M722 Plantar fascial fibromatosis: Secondary | ICD-10-CM

## 2018-10-05 DIAGNOSIS — M79671 Pain in right foot: Secondary | ICD-10-CM

## 2018-10-05 MED ORDER — TRIAMCINOLONE ACETONIDE 10 MG/ML IJ SUSP
10.0000 mg | Freq: Once | INTRAMUSCULAR | Status: AC
  Administered 2018-10-05: 10 mg

## 2018-10-05 NOTE — Progress Notes (Signed)
Subjective:   Patient ID: Norma Nicholson, female   DOB: 73 y.o.   MRN: [id number]   HPI Patient presents stating her left heel is really started to hurt her and she has a problem with her forefoot and orthotics of been very helpful but she probably needs a new pair   ROS      Objective:  Physical Exam  Neurovascular status intact with patient found to have exquisite discomfort in the plantar heel left at the insertional point tendon calcaneus with also forefoot prominence left over right with bone exposure and metatarsal callus formation     Assessment:  Acute plantar fasciitis left with keratotic lesion formation     Plan:  H&P conditions reviewed and today I did sterile prep injected the fascial left 3 mg Kenalog 5 mg Xylocaine and advised on orthotics and scan for a new type of soft customized orthotics with ped orthotist  X-rays indicate that there is spur formation with no indications of acute stress fracture or advanced arthritis

## 2018-10-19 DIAGNOSIS — Z1211 Encounter for screening for malignant neoplasm of colon: Secondary | ICD-10-CM | POA: Diagnosis not present

## 2018-10-19 DIAGNOSIS — Z800 Family history of malignant neoplasm of digestive organs: Secondary | ICD-10-CM | POA: Diagnosis not present

## 2018-10-19 DIAGNOSIS — R194 Change in bowel habit: Secondary | ICD-10-CM | POA: Diagnosis not present

## 2018-10-19 DIAGNOSIS — K5730 Diverticulosis of large intestine without perforation or abscess without bleeding: Secondary | ICD-10-CM | POA: Diagnosis not present

## 2018-10-24 DIAGNOSIS — M5136 Other intervertebral disc degeneration, lumbar region: Secondary | ICD-10-CM | POA: Diagnosis not present

## 2018-10-24 DIAGNOSIS — M545 Low back pain: Secondary | ICD-10-CM | POA: Diagnosis not present

## 2018-10-27 ENCOUNTER — Ambulatory Visit
Admission: RE | Admit: 2018-10-27 | Discharge: 2018-10-27 | Disposition: A | Discharge status: Home / Self Care | Payer: Medicare Other | Source: Ambulatory Visit | Attending: Obstetrics & Gynecology | Admitting: Obstetrics & Gynecology

## 2018-10-27 DIAGNOSIS — Z1231 Encounter for screening mammogram for malignant neoplasm of breast: Secondary | ICD-10-CM

## 2018-10-27 DIAGNOSIS — H43811 Vitreous degeneration, right eye: Secondary | ICD-10-CM | POA: Diagnosis not present

## 2018-10-27 DIAGNOSIS — Z961 Presence of intraocular lens: Secondary | ICD-10-CM | POA: Diagnosis not present

## 2018-10-27 DIAGNOSIS — H40013 Open angle with borderline findings, low risk, bilateral: Secondary | ICD-10-CM | POA: Diagnosis not present

## 2018-10-27 DIAGNOSIS — H26493 Other secondary cataract, bilateral: Secondary | ICD-10-CM | POA: Diagnosis not present

## 2018-11-09 ENCOUNTER — Ambulatory Visit (INDEPENDENT_AMBULATORY_CARE_PROVIDER_SITE_OTHER): Payer: Medicare Other | Admitting: Orthotics

## 2018-11-09 ENCOUNTER — Ambulatory Visit (INDEPENDENT_AMBULATORY_CARE_PROVIDER_SITE_OTHER): Payer: Medicare Other | Admitting: Podiatry

## 2018-11-09 ENCOUNTER — Encounter: Payer: Self-pay | Admitting: Podiatry

## 2018-11-09 ENCOUNTER — Other Ambulatory Visit: Payer: Self-pay

## 2018-11-09 DIAGNOSIS — M722 Plantar fascial fibromatosis: Secondary | ICD-10-CM

## 2018-11-09 DIAGNOSIS — M79672 Pain in left foot: Secondary | ICD-10-CM

## 2018-11-09 DIAGNOSIS — L84 Corns and callosities: Secondary | ICD-10-CM

## 2018-11-09 DIAGNOSIS — Q828 Other specified congenital malformations of skin: Secondary | ICD-10-CM

## 2018-11-09 DIAGNOSIS — M79671 Pain in right foot: Secondary | ICD-10-CM

## 2018-11-09 MED ORDER — TRIAMCINOLONE ACETONIDE 10 MG/ML IJ SUSP
10.0000 mg | Freq: Once | INTRAMUSCULAR | Status: AC
  Administered 2018-11-09: 10 mg

## 2018-11-09 NOTE — Progress Notes (Signed)
Patient came into today to be cast for Custom Foot Orthotics. Upon recommendation of Dr.  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

## 2018-11-09 NOTE — Progress Notes (Signed)
Subjective:   Patient ID: Norma Nicholson, female   DOB: 73 y.o.   MRN: [id number]   HPI Patient states the inside of the heel was doing very well but the outside of the heel has become sore over the last week.  States is hard to walk on   ROS      Objective:  Physical Exam  Neurovascular status intact with exquisite discomfort plantar aspect heel left lateral side     Assessment:  Acute plantar fasciitis left inflammation fluid buildup     Plan:  H&P sterile prep applied and injected the lateral fascial band 3 mg Kenalog 5 mg Xylocaine advised on physical therapy anti-inflammatories reappoint to recheck

## 2018-11-28 ENCOUNTER — Telehealth: Payer: Self-pay | Admitting: Podiatry

## 2018-11-28 NOTE — Telephone Encounter (Signed)
Canceled appt with Liliane Channel to [place] and pt aware. Verified with pt ok to mail orthotics to pt with instructions and if any issues to call to schedule an appt with Liliane Channel.

## 2018-11-29 DIAGNOSIS — E559 Vitamin D deficiency, unspecified: Secondary | ICD-10-CM | POA: Diagnosis not present

## 2018-11-29 DIAGNOSIS — E782 Mixed hyperlipidemia: Secondary | ICD-10-CM | POA: Diagnosis not present

## 2018-11-29 DIAGNOSIS — F411 Generalized anxiety disorder: Secondary | ICD-10-CM | POA: Diagnosis not present

## 2018-11-29 DIAGNOSIS — G4700 Insomnia, unspecified: Secondary | ICD-10-CM | POA: Diagnosis not present

## 2018-11-30 ENCOUNTER — Other Ambulatory Visit: Payer: Medicare Other | Admitting: Orthotics

## 2018-11-30 NOTE — Telephone Encounter (Signed)
Pt called and asked if she could pick up the orthotics instead of mailing them because she needs to drop off the old pair to get refurbished. She said she talked with [place] previously and there is no charge for the refurbished.

## 2018-12-25 DIAGNOSIS — R159 Full incontinence of feces: Secondary | ICD-10-CM | POA: Diagnosis not present

## 2018-12-25 DIAGNOSIS — K5730 Diverticulosis of large intestine without perforation or abscess without bleeding: Secondary | ICD-10-CM | POA: Diagnosis not present

## 2018-12-25 DIAGNOSIS — Z800 Family history of malignant neoplasm of digestive organs: Secondary | ICD-10-CM | POA: Diagnosis not present

## 2018-12-25 DIAGNOSIS — R197 Diarrhea, unspecified: Secondary | ICD-10-CM | POA: Diagnosis not present

## 2018-12-25 DIAGNOSIS — Z1211 Encounter for screening for malignant neoplasm of colon: Secondary | ICD-10-CM | POA: Diagnosis not present

## 2018-12-25 DIAGNOSIS — K52831 Collagenous colitis: Secondary | ICD-10-CM | POA: Diagnosis not present

## 2019-01-02 ENCOUNTER — Ambulatory Visit (INDEPENDENT_AMBULATORY_CARE_PROVIDER_SITE_OTHER): Payer: Medicare Other | Admitting: Orthotics

## 2019-01-02 ENCOUNTER — Other Ambulatory Visit: Payer: Self-pay

## 2019-01-02 DIAGNOSIS — M79671 Pain in right foot: Secondary | ICD-10-CM

## 2019-01-02 DIAGNOSIS — Q828 Other specified congenital malformations of skin: Secondary | ICD-10-CM

## 2019-01-02 NOTE — Progress Notes (Signed)
Patient wants met pad removed and cover material changed back to spenco as was her old ones (from [employer])

## 2019-01-08 DIAGNOSIS — Z800 Family history of malignant neoplasm of digestive organs: Secondary | ICD-10-CM | POA: Diagnosis not present

## 2019-01-08 DIAGNOSIS — K5730 Diverticulosis of large intestine without perforation or abscess without bleeding: Secondary | ICD-10-CM | POA: Diagnosis not present

## 2019-01-08 DIAGNOSIS — D125 Benign neoplasm of sigmoid colon: Secondary | ICD-10-CM | POA: Diagnosis not present

## 2019-01-08 DIAGNOSIS — Z1211 Encounter for screening for malignant neoplasm of colon: Secondary | ICD-10-CM | POA: Diagnosis not present

## 2019-01-08 DIAGNOSIS — K635 Polyp of colon: Secondary | ICD-10-CM | POA: Diagnosis not present

## 2019-01-16 ENCOUNTER — Other Ambulatory Visit: Payer: Medicare Other | Admitting: Orthotics

## 2019-01-16 DIAGNOSIS — Z124 Encounter for screening for malignant neoplasm of cervix: Secondary | ICD-10-CM | POA: Diagnosis not present

## 2019-01-16 DIAGNOSIS — Z6829 Body mass index (BMI) 29.0-29.9, adult: Secondary | ICD-10-CM | POA: Diagnosis not present

## 2019-01-18 ENCOUNTER — Telehealth: Payer: Self-pay | Admitting: Podiatry

## 2019-01-18 NOTE — Telephone Encounter (Signed)
Corrected orthotics in.lvm for pt to call to schedule an appt to pick them up.Marland Kitchen

## 2019-01-23 ENCOUNTER — Ambulatory Visit (INDEPENDENT_AMBULATORY_CARE_PROVIDER_SITE_OTHER): Payer: Medicare Other | Admitting: Orthotics

## 2019-01-23 ENCOUNTER — Other Ambulatory Visit: Payer: Self-pay

## 2019-01-23 DIAGNOSIS — M722 Plantar fascial fibromatosis: Secondary | ICD-10-CM

## 2019-01-23 DIAGNOSIS — Q828 Other specified congenital malformations of skin: Secondary | ICD-10-CM

## 2019-01-23 NOTE — Progress Notes (Signed)
Patient came in today to pick up custom made foot orthotics.  The goals were accomplished and the patient reported no dissatisfaction with said orthotics.  Patient was advised of breakin period and how to report any issues. 

## 2019-01-29 DIAGNOSIS — F411 Generalized anxiety disorder: Secondary | ICD-10-CM | POA: Diagnosis not present

## 2019-01-29 DIAGNOSIS — R42 Dizziness and giddiness: Secondary | ICD-10-CM | POA: Diagnosis not present

## 2019-01-29 DIAGNOSIS — G4700 Insomnia, unspecified: Secondary | ICD-10-CM | POA: Diagnosis not present

## 2019-01-29 DIAGNOSIS — E782 Mixed hyperlipidemia: Secondary | ICD-10-CM | POA: Diagnosis not present

## 2019-02-06 DIAGNOSIS — M5136 Other intervertebral disc degeneration, lumbar region: Secondary | ICD-10-CM | POA: Insufficient documentation

## 2019-02-12 DIAGNOSIS — Z0000 Encounter for general adult medical examination without abnormal findings: Secondary | ICD-10-CM | POA: Diagnosis not present

## 2019-02-26 DIAGNOSIS — E782 Mixed hyperlipidemia: Secondary | ICD-10-CM | POA: Diagnosis not present

## 2019-02-26 DIAGNOSIS — E559 Vitamin D deficiency, unspecified: Secondary | ICD-10-CM | POA: Diagnosis not present

## 2019-02-26 DIAGNOSIS — Z79899 Other long term (current) drug therapy: Secondary | ICD-10-CM | POA: Diagnosis not present

## 2019-03-01 DIAGNOSIS — Z23 Encounter for immunization: Secondary | ICD-10-CM | POA: Diagnosis not present

## 2019-03-02 DIAGNOSIS — G4700 Insomnia, unspecified: Secondary | ICD-10-CM | POA: Diagnosis not present

## 2019-03-02 DIAGNOSIS — F411 Generalized anxiety disorder: Secondary | ICD-10-CM | POA: Diagnosis not present

## 2019-03-02 DIAGNOSIS — E559 Vitamin D deficiency, unspecified: Secondary | ICD-10-CM | POA: Diagnosis not present

## 2019-03-02 DIAGNOSIS — E782 Mixed hyperlipidemia: Secondary | ICD-10-CM | POA: Diagnosis not present

## 2019-03-05 DIAGNOSIS — H40013 Open angle with borderline findings, low risk, bilateral: Secondary | ICD-10-CM | POA: Diagnosis not present

## 2019-03-05 DIAGNOSIS — H04123 Dry eye syndrome of bilateral lacrimal glands: Secondary | ICD-10-CM | POA: Diagnosis not present

## 2019-03-05 DIAGNOSIS — H524 Presbyopia: Secondary | ICD-10-CM | POA: Diagnosis not present

## 2019-03-05 DIAGNOSIS — Z961 Presence of intraocular lens: Secondary | ICD-10-CM | POA: Diagnosis not present

## 2019-03-05 DIAGNOSIS — H10413 Chronic giant papillary conjunctivitis, bilateral: Secondary | ICD-10-CM | POA: Diagnosis not present

## 2019-04-27 ENCOUNTER — Other Ambulatory Visit: Payer: Self-pay

## 2019-04-27 ENCOUNTER — Encounter: Payer: Self-pay | Admitting: Podiatry

## 2019-04-27 ENCOUNTER — Ambulatory Visit (INDEPENDENT_AMBULATORY_CARE_PROVIDER_SITE_OTHER): Payer: Medicare Other | Admitting: Podiatry

## 2019-04-27 DIAGNOSIS — M722 Plantar fascial fibromatosis: Secondary | ICD-10-CM

## 2019-05-02 NOTE — Progress Notes (Signed)
Subjective:   Patient ID: Norma Nicholson, female   DOB: 73 y.o.   MRN: [id number]   HPI Patient states previous injection did well and she would like another 1 today   ROS      Objective:  Physical Exam  Neurovascular status intact with exquisite discomfort plantar left heel at the insertional point tendon calcaneus with inflammation fluid around the medial band     Assessment:  Acute flareup plantar fasciitis left     Plan:  Sterile prep injected the fascia 3 mg Kenalog 5 mg Xylocaine and instructed this patient on anti-inflammatories physical therapy.  Patient will be seen back as symptoms were to indicate

## 2019-05-24 DIAGNOSIS — Z23 Encounter for immunization: Secondary | ICD-10-CM | POA: Diagnosis not present

## 2019-05-28 DIAGNOSIS — E782 Mixed hyperlipidemia: Secondary | ICD-10-CM | POA: Diagnosis not present

## 2019-05-28 DIAGNOSIS — E559 Vitamin D deficiency, unspecified: Secondary | ICD-10-CM | POA: Diagnosis not present

## 2019-06-01 DIAGNOSIS — E559 Vitamin D deficiency, unspecified: Secondary | ICD-10-CM | POA: Diagnosis not present

## 2019-06-01 DIAGNOSIS — G4700 Insomnia, unspecified: Secondary | ICD-10-CM | POA: Diagnosis not present

## 2019-06-01 DIAGNOSIS — E782 Mixed hyperlipidemia: Secondary | ICD-10-CM | POA: Diagnosis not present

## 2019-06-01 DIAGNOSIS — F411 Generalized anxiety disorder: Secondary | ICD-10-CM | POA: Diagnosis not present

## 2019-06-01 DIAGNOSIS — N390 Urinary tract infection, site not specified: Secondary | ICD-10-CM | POA: Diagnosis not present

## 2019-06-06 DIAGNOSIS — H43811 Vitreous degeneration, right eye: Secondary | ICD-10-CM | POA: Diagnosis not present

## 2019-06-06 DIAGNOSIS — H40013 Open angle with borderline findings, low risk, bilateral: Secondary | ICD-10-CM | POA: Diagnosis not present

## 2019-06-06 DIAGNOSIS — H26492 Other secondary cataract, left eye: Secondary | ICD-10-CM | POA: Diagnosis not present

## 2019-06-06 DIAGNOSIS — Z961 Presence of intraocular lens: Secondary | ICD-10-CM | POA: Diagnosis not present

## 2019-06-11 DIAGNOSIS — H26491 Other secondary cataract, right eye: Secondary | ICD-10-CM | POA: Diagnosis not present

## 2019-07-09 DIAGNOSIS — F331 Major depressive disorder, recurrent, moderate: Secondary | ICD-10-CM | POA: Diagnosis not present

## 2019-07-09 DIAGNOSIS — F411 Generalized anxiety disorder: Secondary | ICD-10-CM | POA: Diagnosis not present

## 2019-07-09 DIAGNOSIS — R159 Full incontinence of feces: Secondary | ICD-10-CM | POA: Diagnosis not present

## 2019-07-09 DIAGNOSIS — G4700 Insomnia, unspecified: Secondary | ICD-10-CM | POA: Diagnosis not present

## 2019-07-12 ENCOUNTER — Other Ambulatory Visit: Payer: Self-pay

## 2019-07-12 ENCOUNTER — Encounter: Payer: Self-pay | Admitting: Podiatry

## 2019-07-12 ENCOUNTER — Ambulatory Visit (INDEPENDENT_AMBULATORY_CARE_PROVIDER_SITE_OTHER): Payer: Medicare Other | Admitting: Podiatry

## 2019-07-12 DIAGNOSIS — M7752 Other enthesopathy of left foot: Secondary | ICD-10-CM | POA: Diagnosis not present

## 2019-07-12 DIAGNOSIS — M779 Enthesopathy, unspecified: Secondary | ICD-10-CM

## 2019-07-12 DIAGNOSIS — B070 Plantar wart: Secondary | ICD-10-CM | POA: Diagnosis not present

## 2019-07-12 NOTE — Progress Notes (Signed)
Subjective:   Patient ID: Norma Nicholson, female   DOB: 73 y.o.   MRN: [id number]   HPI Patient points to the left foot states that it is been sore and there is inflammation with a lesion and also feels like the orthotic is still not fitting right on the right foot   ROS      Objective:  Physical Exam  Neurovascular status intact with patient's left midfoot showing inflammation base of fifth metatarsal plantar and I also noted a lesion proximal to this area that upon debridement shows pinpoint bleeding     Assessment:  Inflammatory capsulitis fifth MPJ left plantar and has lesion which may be verruca versus porokeratotic type tissue     Plan:  Educated on both conditions today I did sterile prep injected the plantar capsule left 3 mg Kenalog 5 mg Xylocaine and went ahead did deep debridement of lesion applied salicylic acid with immobilization and leave this on 24 hours and reappoint as symptoms indicate

## 2019-07-18 ENCOUNTER — Other Ambulatory Visit: Payer: Self-pay

## 2019-08-06 DIAGNOSIS — G4700 Insomnia, unspecified: Secondary | ICD-10-CM | POA: Diagnosis not present

## 2019-08-06 DIAGNOSIS — F411 Generalized anxiety disorder: Secondary | ICD-10-CM | POA: Diagnosis not present

## 2019-08-06 DIAGNOSIS — R159 Full incontinence of feces: Secondary | ICD-10-CM | POA: Diagnosis not present

## 2019-08-06 DIAGNOSIS — F331 Major depressive disorder, recurrent, moderate: Secondary | ICD-10-CM | POA: Diagnosis not present

## 2019-09-14 ENCOUNTER — Other Ambulatory Visit: Payer: Self-pay | Admitting: Obstetrics & Gynecology

## 2019-09-14 DIAGNOSIS — Z1231 Encounter for screening mammogram for malignant neoplasm of breast: Secondary | ICD-10-CM

## 2019-09-17 DIAGNOSIS — Z961 Presence of intraocular lens: Secondary | ICD-10-CM | POA: Diagnosis not present

## 2019-09-17 DIAGNOSIS — H43811 Vitreous degeneration, right eye: Secondary | ICD-10-CM | POA: Diagnosis not present

## 2019-09-17 DIAGNOSIS — H04123 Dry eye syndrome of bilateral lacrimal glands: Secondary | ICD-10-CM | POA: Diagnosis not present

## 2019-09-17 DIAGNOSIS — H40013 Open angle with borderline findings, low risk, bilateral: Secondary | ICD-10-CM | POA: Diagnosis not present

## 2019-09-18 ENCOUNTER — Other Ambulatory Visit: Payer: Self-pay | Admitting: Dermatology

## 2019-09-18 DIAGNOSIS — L820 Inflamed seborrheic keratosis: Secondary | ICD-10-CM | POA: Diagnosis not present

## 2019-09-18 DIAGNOSIS — D485 Neoplasm of uncertain behavior of skin: Secondary | ICD-10-CM | POA: Diagnosis not present

## 2019-09-18 DIAGNOSIS — Z800 Family history of malignant neoplasm of digestive organs: Secondary | ICD-10-CM | POA: Diagnosis not present

## 2019-09-18 DIAGNOSIS — K52831 Collagenous colitis: Secondary | ICD-10-CM | POA: Diagnosis not present

## 2019-09-18 DIAGNOSIS — L570 Actinic keratosis: Secondary | ICD-10-CM | POA: Diagnosis not present

## 2019-09-18 DIAGNOSIS — K5730 Diverticulosis of large intestine without perforation or abscess without bleeding: Secondary | ICD-10-CM | POA: Diagnosis not present

## 2019-09-19 ENCOUNTER — Ambulatory Visit: Payer: Medicare Other

## 2019-09-20 DIAGNOSIS — K52831 Collagenous colitis: Secondary | ICD-10-CM | POA: Diagnosis not present

## 2019-09-20 DIAGNOSIS — K5730 Diverticulosis of large intestine without perforation or abscess without bleeding: Secondary | ICD-10-CM | POA: Diagnosis not present

## 2019-09-20 DIAGNOSIS — Z800 Family history of malignant neoplasm of digestive organs: Secondary | ICD-10-CM | POA: Diagnosis not present

## 2019-09-28 ENCOUNTER — Ambulatory Visit: Payer: Medicare Other | Attending: Internal Medicine

## 2019-09-28 DIAGNOSIS — Z23 Encounter for immunization: Secondary | ICD-10-CM | POA: Insufficient documentation

## 2019-09-28 NOTE — Progress Notes (Signed)
   Covid-19 Vaccination Clinic  Name:  Norma Nicholson    MRN: [id number] DOB: 08-11-1946  09/28/2019  [name] was observed post Covid-19 immunization for 15 minutes without incidence. She was provided with Vaccine Information Sheet and instruction to access the V-Safe system.   [name] was instructed to call 911 with any severe reactions post vaccine: Marland Kitchen Difficulty breathing  . Swelling of your face and throat  . A fast heartbeat  . A bad rash all over your body  . Dizziness and weakness    Immunizations Administered    Name Date Dose VIS Date Route   Pfizer COVID-19 Vaccine 09/28/2019  1:23 PM 0.3 mL 08/03/2019 Intramuscular   Manufacturer: [place]   Lot: [id number]   [place]: [id number]

## 2019-10-23 ENCOUNTER — Ambulatory Visit: Payer: Medicare Other | Attending: Internal Medicine

## 2019-10-23 DIAGNOSIS — Z23 Encounter for immunization: Secondary | ICD-10-CM | POA: Insufficient documentation

## 2019-10-23 NOTE — Progress Notes (Signed)
   Covid-19 Vaccination Clinic  Name:  Alohilani Yaldo    MRN: [id number] DOB: 09-07-1945  10/23/2019  Ms. Stuckey was observed post Covid-19 immunization for 15 minutes without incident. She was provided with Vaccine Information Sheet and instruction to access the V-Safe system.   Ms. Dilullo was instructed to call 911 with any severe reactions post vaccine: Marland Kitchen Difficulty breathing  . Swelling of face and throat  . A fast heartbeat  . A bad rash all over body  . Dizziness and weakness   Immunizations Administered    Name Date Dose VIS Date Route   Pfizer COVID-19 Vaccine 10/23/2019  1:45 PM 0.3 mL 08/03/2019 Intramuscular   Manufacturer: [place]   Lot: [id number]   [place]: [id number]

## 2019-10-29 ENCOUNTER — Ambulatory Visit
Admission: RE | Admit: 2019-10-29 | Discharge: 2019-10-29 | Disposition: A | Discharge status: Home / Self Care | Payer: Medicare Other | Source: Ambulatory Visit | Attending: Obstetrics & Gynecology | Admitting: Obstetrics & Gynecology

## 2019-10-29 ENCOUNTER — Other Ambulatory Visit: Payer: Self-pay

## 2019-10-29 DIAGNOSIS — Z1231 Encounter for screening mammogram for malignant neoplasm of breast: Secondary | ICD-10-CM | POA: Diagnosis not present

## 2019-10-31 DIAGNOSIS — Z961 Presence of intraocular lens: Secondary | ICD-10-CM | POA: Diagnosis not present

## 2019-10-31 DIAGNOSIS — H43811 Vitreous degeneration, right eye: Secondary | ICD-10-CM | POA: Diagnosis not present

## 2019-10-31 DIAGNOSIS — H35372 Puckering of macula, left eye: Secondary | ICD-10-CM | POA: Diagnosis not present

## 2019-10-31 DIAGNOSIS — H10413 Chronic giant papillary conjunctivitis, bilateral: Secondary | ICD-10-CM | POA: Diagnosis not present

## 2019-10-31 DIAGNOSIS — H43812 Vitreous degeneration, left eye: Secondary | ICD-10-CM | POA: Diagnosis not present

## 2019-10-31 DIAGNOSIS — H40013 Open angle with borderline findings, low risk, bilateral: Secondary | ICD-10-CM | POA: Diagnosis not present

## 2019-11-01 DIAGNOSIS — S0993XA Unspecified injury of face, initial encounter: Secondary | ICD-10-CM | POA: Diagnosis not present

## 2019-11-01 DIAGNOSIS — W19XXXA Unspecified fall, initial encounter: Secondary | ICD-10-CM | POA: Diagnosis not present

## 2019-11-01 DIAGNOSIS — T148XXA Other injury of unspecified body region, initial encounter: Secondary | ICD-10-CM | POA: Diagnosis not present

## 2019-11-22 ENCOUNTER — Other Ambulatory Visit: Payer: Self-pay

## 2019-11-22 ENCOUNTER — Encounter: Payer: Self-pay | Admitting: Podiatry

## 2019-11-22 ENCOUNTER — Ambulatory Visit (INDEPENDENT_AMBULATORY_CARE_PROVIDER_SITE_OTHER): Payer: Medicare Other | Admitting: Podiatry

## 2019-11-22 VITALS — Temp 97.3°F

## 2019-11-22 DIAGNOSIS — M778 Other enthesopathies, not elsewhere classified: Secondary | ICD-10-CM

## 2019-11-22 DIAGNOSIS — Q828 Other specified congenital malformations of skin: Secondary | ICD-10-CM

## 2019-11-22 DIAGNOSIS — M779 Enthesopathy, unspecified: Secondary | ICD-10-CM

## 2019-11-22 NOTE — Progress Notes (Signed)
Subjective:   Patient ID: Norma Nicholson, female   DOB: 74 y.o.   MRN: [id number]   HPI Patient presents with a lot of inflammation underneath the left foot and states it is been sore and lesion has reformed   ROS      Objective:  Physical Exam  Neurovascular status intact with inflammation pain base of fifth metatarsal left fluid buildup with lesion formation and also around the metatarsals     Assessment:  Inflammatory capsulitis base of fifth MPJ left with lesion formation     Plan:  H&P done sterile prep done injected the capsule 3 mg Dexasone Kenalog 5 mg Xylocaine debrided lesions and reappoint and also I like for her to bring orthotics at next visit.  Evaluation

## 2019-11-26 DIAGNOSIS — R7309 Other abnormal glucose: Secondary | ICD-10-CM | POA: Diagnosis not present

## 2019-11-26 DIAGNOSIS — Z79899 Other long term (current) drug therapy: Secondary | ICD-10-CM | POA: Diagnosis not present

## 2019-11-26 DIAGNOSIS — E559 Vitamin D deficiency, unspecified: Secondary | ICD-10-CM | POA: Diagnosis not present

## 2019-11-26 DIAGNOSIS — E782 Mixed hyperlipidemia: Secondary | ICD-10-CM | POA: Diagnosis not present

## 2019-11-30 DIAGNOSIS — E782 Mixed hyperlipidemia: Secondary | ICD-10-CM | POA: Diagnosis not present

## 2019-11-30 DIAGNOSIS — E559 Vitamin D deficiency, unspecified: Secondary | ICD-10-CM | POA: Diagnosis not present

## 2019-11-30 DIAGNOSIS — R159 Full incontinence of feces: Secondary | ICD-10-CM | POA: Diagnosis not present

## 2019-11-30 DIAGNOSIS — G4700 Insomnia, unspecified: Secondary | ICD-10-CM | POA: Diagnosis not present

## 2019-11-30 DIAGNOSIS — F411 Generalized anxiety disorder: Secondary | ICD-10-CM | POA: Diagnosis not present

## 2019-12-06 ENCOUNTER — Ambulatory Visit (INDEPENDENT_AMBULATORY_CARE_PROVIDER_SITE_OTHER): Payer: Medicare Other | Admitting: Podiatry

## 2019-12-06 ENCOUNTER — Encounter: Payer: Self-pay | Admitting: Podiatry

## 2019-12-06 ENCOUNTER — Other Ambulatory Visit: Payer: Self-pay

## 2019-12-06 VITALS — Temp 96.0°F

## 2019-12-06 DIAGNOSIS — B070 Plantar wart: Secondary | ICD-10-CM

## 2019-12-06 NOTE — Progress Notes (Signed)
Subjective:   Patient ID: Norma Nicholson, female   DOB: 74 y.o.   MRN: [id number]   HPI Patient states been new spot popped up on the bottom of my left foot   ROS      Objective:  Physical Exam  Neurovascular status intact with keratotic lesion upon debridement shows pinpoint bleeding plantar fourth metatarsal left that is painful     Assessment:  Acute verruca plantaris left     Plan:  H&P reviewed condition and acute sharp debridement of lesion accomplished applied agent to create immune response along with sterile dressing and reappoint to recheck

## 2020-01-03 IMAGING — MG DIGITAL SCREENING BILATERAL MAMMOGRAM WITH TOMO AND CAD
8 series · 9 of 24 positions shown · non-contrast
Comparison: Previous exam(s).

CLINICAL DATA: Screening.

EXAM:
DIGITAL SCREENING BILATERAL MAMMOGRAM WITH TOMO AND CAD

[L MLO synth-2D]
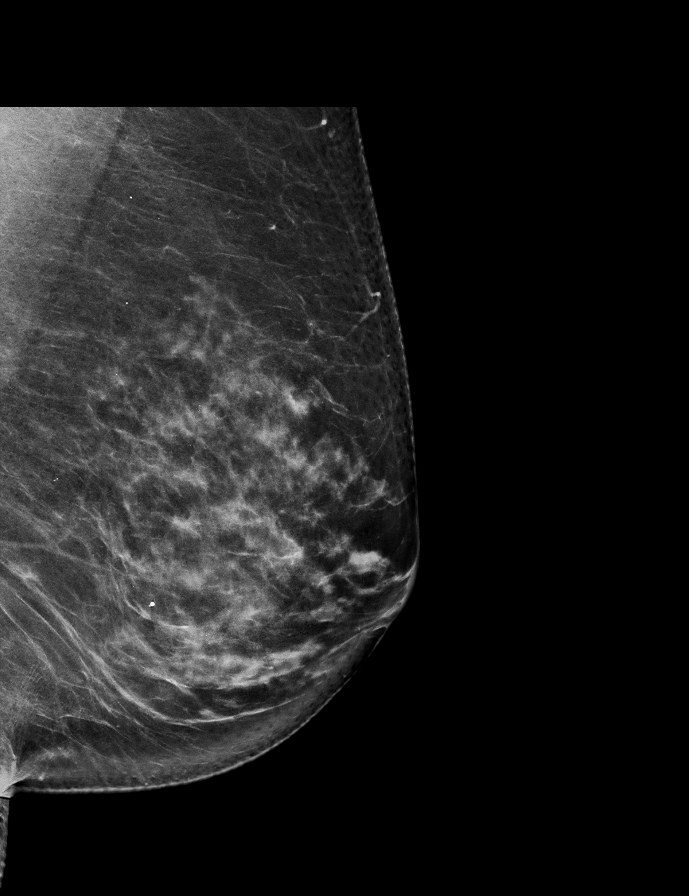

[R MLO synth-2D]
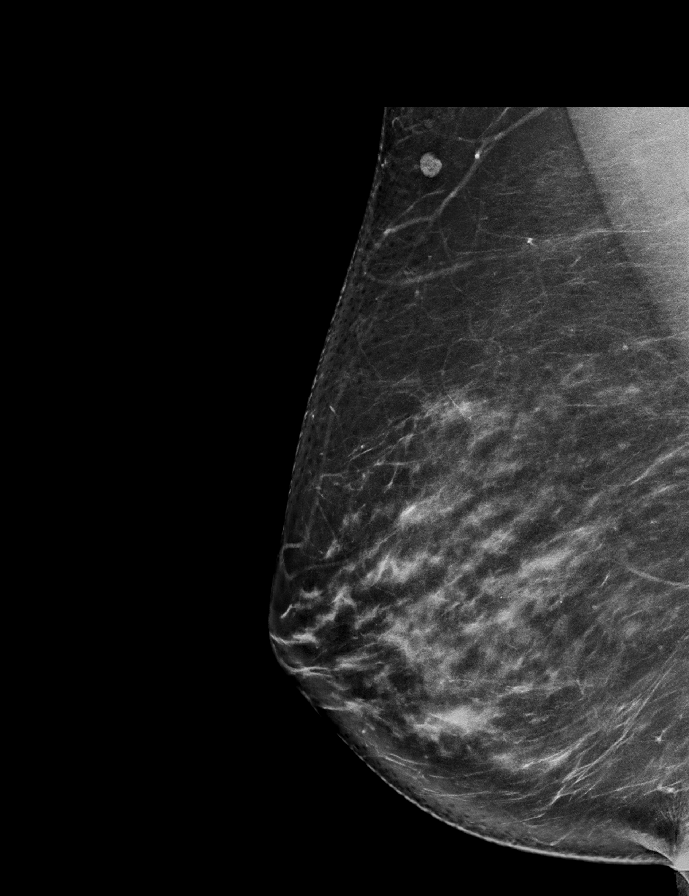

[L CC synth-2D]
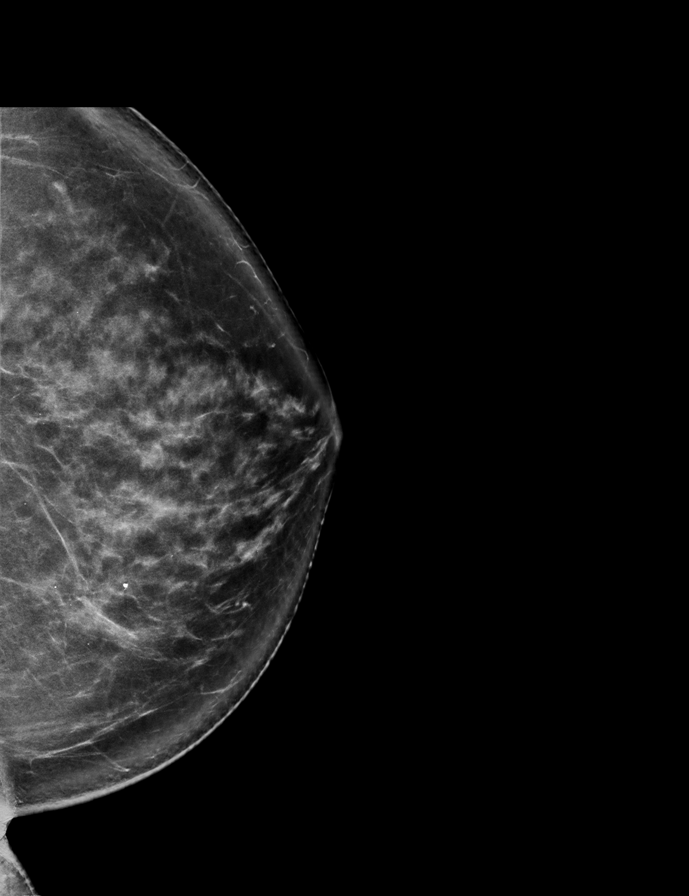

[R CC synth-2D]
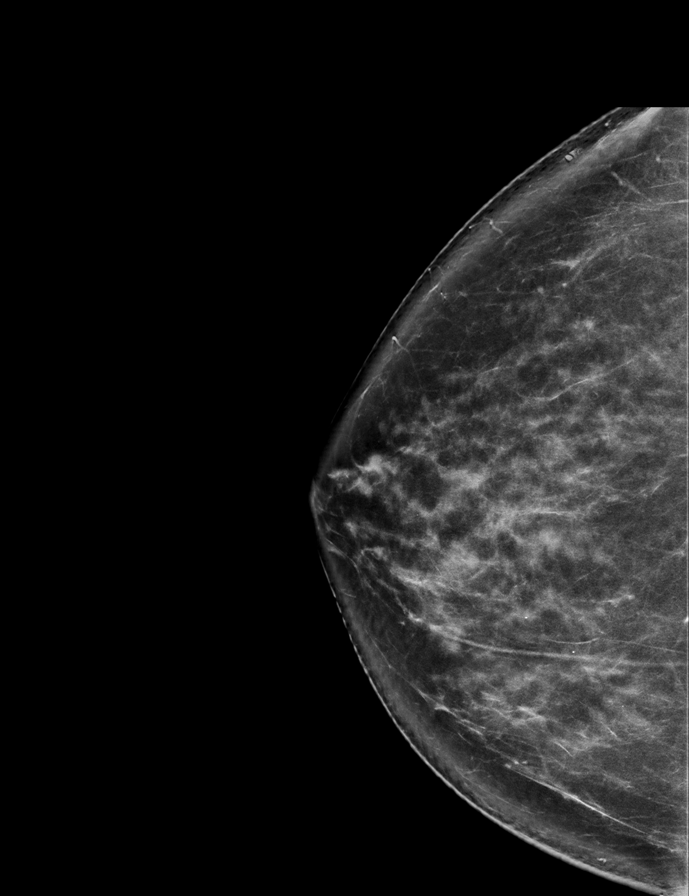

[R MLO tomo · 2 of 86 frames shown]
[frame 28/86]
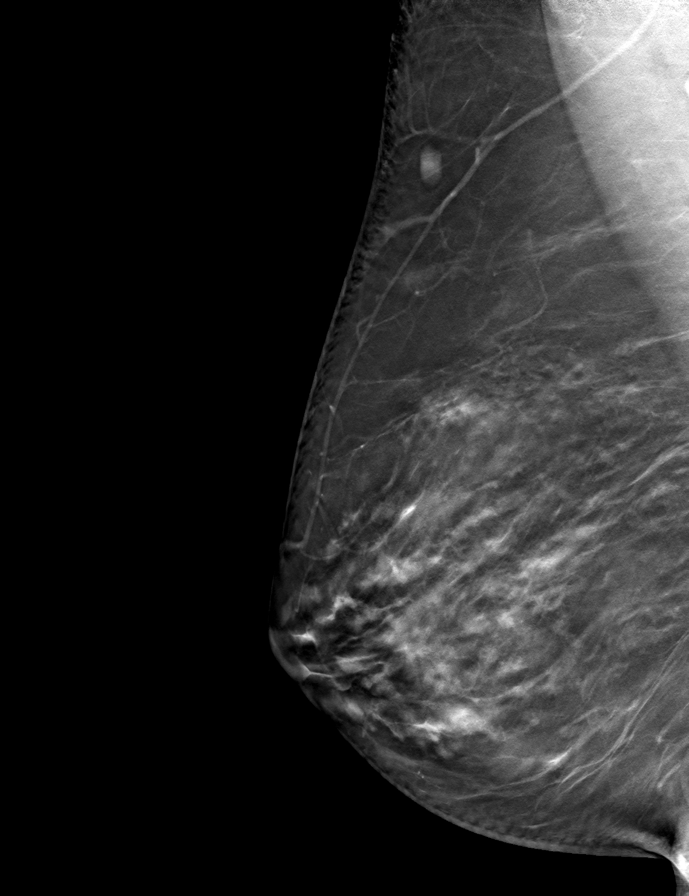
[frame 43/86]
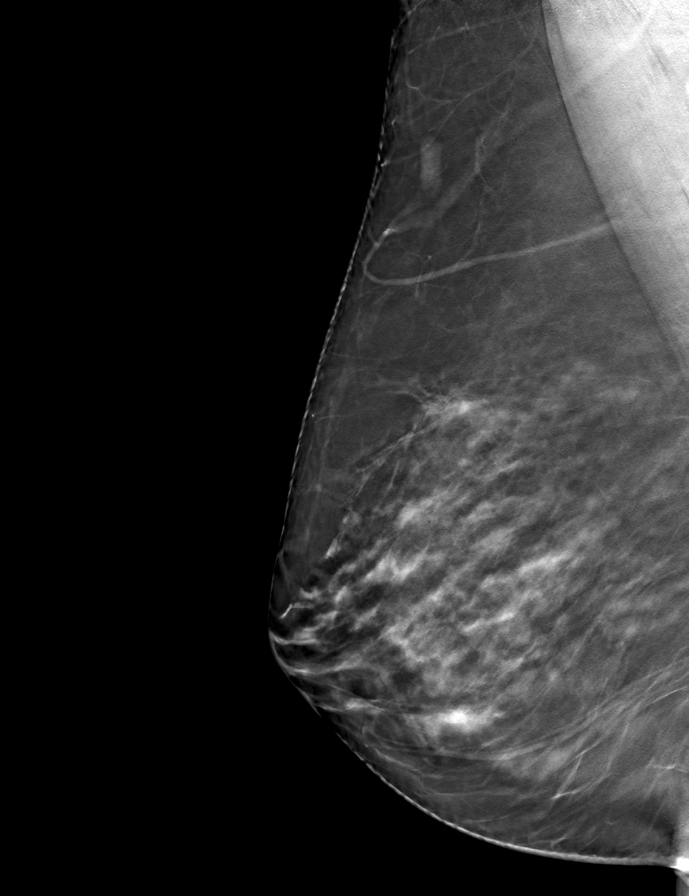

[L MLO tomo · tomo slice 43/86.0]
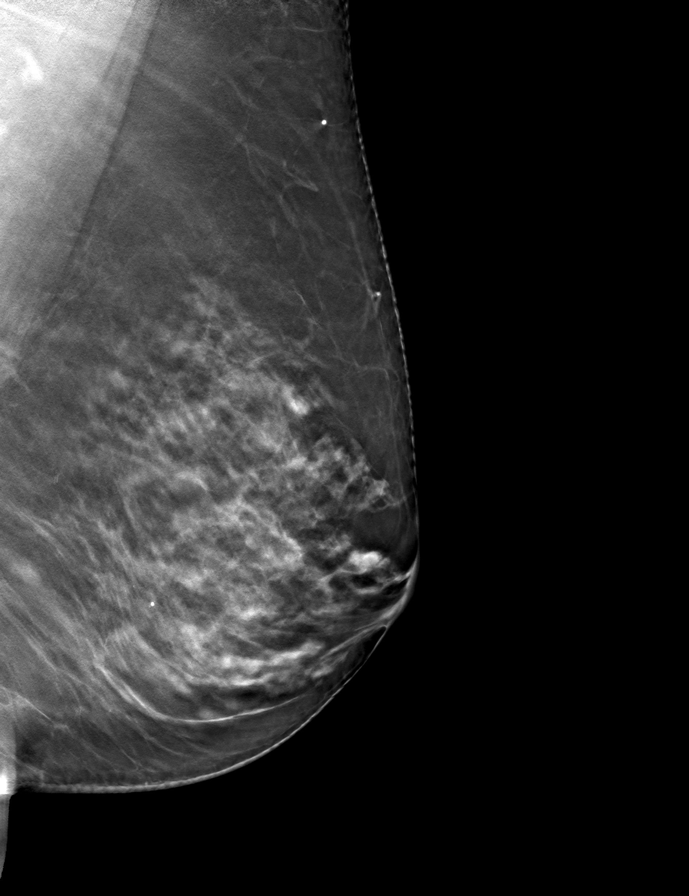

[R CC tomo · tomo slice 43/85.0]
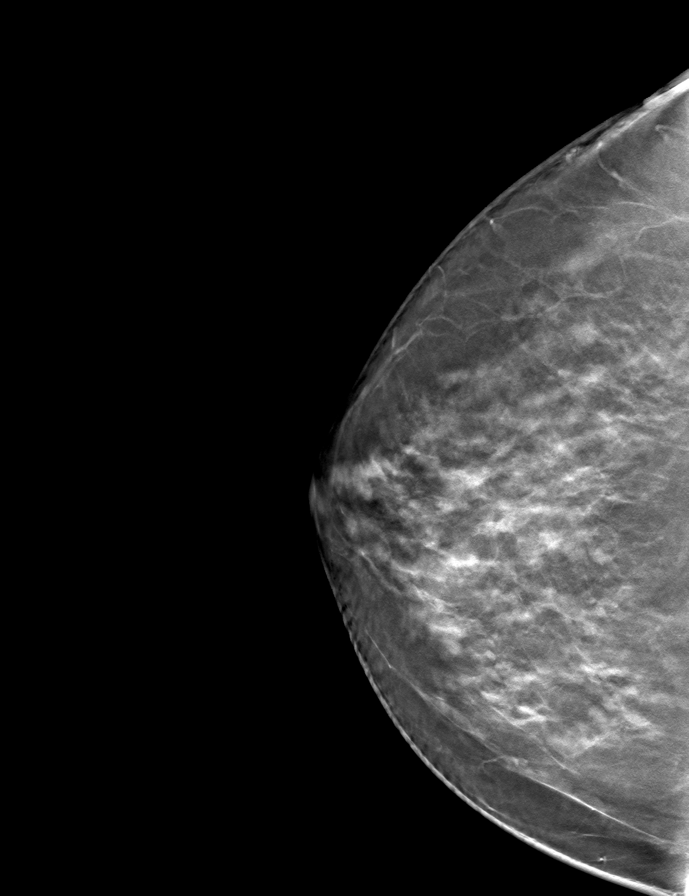

[L CC tomo · tomo slice 45/88.0]
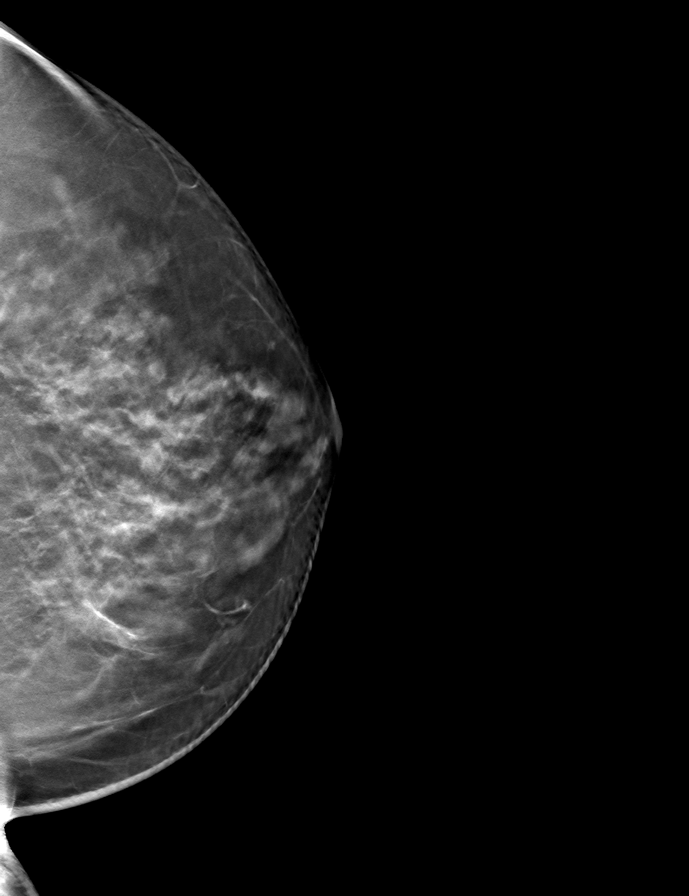

[9 of 24 positions shown; findings below may reference images not displayed]

ACR Breast Density Category c: The breast tissue is heterogeneously
dense, which may obscure small masses.
FINDINGS: There are no findings suspicious for malignancy. Images were
processed with CAD.
IMPRESSION: No mammographic evidence of malignancy. A result letter of this
screening mammogram will be mailed directly to the patient.

RECOMMENDATION:
Screening mammogram in one year. (Code:FT-U-LHB)

BI-RADS CATEGORY  1: Negative.

## 2020-01-07 DIAGNOSIS — Z961 Presence of intraocular lens: Secondary | ICD-10-CM | POA: Diagnosis not present

## 2020-01-07 DIAGNOSIS — H04123 Dry eye syndrome of bilateral lacrimal glands: Secondary | ICD-10-CM | POA: Diagnosis not present

## 2020-01-07 DIAGNOSIS — H10413 Chronic giant papillary conjunctivitis, bilateral: Secondary | ICD-10-CM | POA: Diagnosis not present

## 2020-01-07 DIAGNOSIS — H40013 Open angle with borderline findings, low risk, bilateral: Secondary | ICD-10-CM | POA: Diagnosis not present

## 2020-01-25 DIAGNOSIS — G4700 Insomnia, unspecified: Secondary | ICD-10-CM | POA: Diagnosis not present

## 2020-01-25 DIAGNOSIS — K5900 Constipation, unspecified: Secondary | ICD-10-CM | POA: Diagnosis not present

## 2020-01-25 DIAGNOSIS — F411 Generalized anxiety disorder: Secondary | ICD-10-CM | POA: Diagnosis not present

## 2020-01-25 DIAGNOSIS — K52831 Collagenous colitis: Secondary | ICD-10-CM | POA: Diagnosis not present

## 2020-03-20 DIAGNOSIS — F411 Generalized anxiety disorder: Secondary | ICD-10-CM | POA: Diagnosis not present

## 2020-03-20 DIAGNOSIS — K5909 Other constipation: Secondary | ICD-10-CM | POA: Diagnosis not present

## 2020-03-20 DIAGNOSIS — E782 Mixed hyperlipidemia: Secondary | ICD-10-CM | POA: Diagnosis not present

## 2020-04-03 ENCOUNTER — Telehealth: Payer: Self-pay | Admitting: Internal Medicine

## 2020-04-03 NOTE — Telephone Encounter (Signed)
Hi Dr. 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 Patient has been established with Dr. Collene Mares. However, she is looking to transfer her GI care over to [place]. Her GI records have been received and they will be sent to you for review. Please advise on scheduling. Thank you.

## 2020-04-10 NOTE — Telephone Encounter (Signed)
Unfortunately, my practice cannot accommodate this patient, with establish local GI care, due to the nature nature of my practice.  I am so sorry.  You may offer to a colleague with a less mature practice.  Thank you.

## 2020-04-11 NOTE — Telephone Encounter (Signed)
Pt informed of Dr. Blanch Media message. She will keep looking for a different practice. Records will be shredded.

## 2020-04-22 DIAGNOSIS — F411 Generalized anxiety disorder: Secondary | ICD-10-CM | POA: Diagnosis not present

## 2020-04-22 DIAGNOSIS — F331 Major depressive disorder, recurrent, moderate: Secondary | ICD-10-CM | POA: Diagnosis not present

## 2020-04-22 DIAGNOSIS — Z23 Encounter for immunization: Secondary | ICD-10-CM | POA: Diagnosis not present

## 2020-04-22 DIAGNOSIS — R109 Unspecified abdominal pain: Secondary | ICD-10-CM | POA: Diagnosis not present

## 2020-04-22 DIAGNOSIS — G4700 Insomnia, unspecified: Secondary | ICD-10-CM | POA: Diagnosis not present

## 2020-04-22 DIAGNOSIS — R634 Abnormal weight loss: Secondary | ICD-10-CM | POA: Diagnosis not present

## 2020-04-22 DIAGNOSIS — E559 Vitamin D deficiency, unspecified: Secondary | ICD-10-CM | POA: Diagnosis not present

## 2020-05-12 DIAGNOSIS — G4700 Insomnia, unspecified: Secondary | ICD-10-CM | POA: Diagnosis not present

## 2020-05-12 DIAGNOSIS — F411 Generalized anxiety disorder: Secondary | ICD-10-CM | POA: Diagnosis not present

## 2020-05-12 DIAGNOSIS — M542 Cervicalgia: Secondary | ICD-10-CM | POA: Diagnosis not present

## 2020-05-12 DIAGNOSIS — K529 Noninfective gastroenteritis and colitis, unspecified: Secondary | ICD-10-CM | POA: Diagnosis not present

## 2020-05-12 DIAGNOSIS — E559 Vitamin D deficiency, unspecified: Secondary | ICD-10-CM | POA: Diagnosis not present

## 2020-05-12 DIAGNOSIS — R634 Abnormal weight loss: Secondary | ICD-10-CM | POA: Diagnosis not present

## 2020-05-13 DIAGNOSIS — Z23 Encounter for immunization: Secondary | ICD-10-CM | POA: Diagnosis not present

## 2020-05-16 DIAGNOSIS — G4700 Insomnia, unspecified: Secondary | ICD-10-CM | POA: Diagnosis not present

## 2020-05-16 DIAGNOSIS — E559 Vitamin D deficiency, unspecified: Secondary | ICD-10-CM | POA: Diagnosis not present

## 2020-05-16 DIAGNOSIS — R634 Abnormal weight loss: Secondary | ICD-10-CM | POA: Diagnosis not present

## 2020-05-16 DIAGNOSIS — K529 Noninfective gastroenteritis and colitis, unspecified: Secondary | ICD-10-CM | POA: Diagnosis not present

## 2020-07-24 ENCOUNTER — Ambulatory Visit (INDEPENDENT_AMBULATORY_CARE_PROVIDER_SITE_OTHER): Payer: Medicare Other | Admitting: Podiatry

## 2020-07-24 ENCOUNTER — Other Ambulatory Visit: Payer: Self-pay

## 2020-07-24 DIAGNOSIS — M779 Enthesopathy, unspecified: Secondary | ICD-10-CM

## 2020-07-24 MED ORDER — TRIAMCINOLONE ACETONIDE 10 MG/ML IJ SUSP
10.0000 mg | Freq: Once | INTRAMUSCULAR | Status: AC
  Administered 2020-07-24: 10 mg

## 2020-07-27 NOTE — Progress Notes (Signed)
Subjective:   Patient ID: Norma Nicholson, female   DOB: 74 y.o.   MRN: [id number]   HPI Patient presents stating she has developed a lot of inflammation and fluid buildup around the fifth metatarsal base secondary to pressure in gait and also has developed keratotic lesion formation    ROS      Objective:  Physical Exam  Neurovascular status intact with inflammation fluid of the fifth MPJ base with lesion formation also painful     Assessment:  Inflammatory capsulitis fifth MPJ base with lesion     Plan:  H&P reviewed condition sterile prep done injected the capsule 3 mg dexamethasone Kenalog 5 mg Xylocaine debrided lesions and reappoint as needed

## 2020-08-11 ENCOUNTER — Encounter: Payer: Self-pay | Admitting: Dermatology

## 2020-08-11 ENCOUNTER — Other Ambulatory Visit: Payer: Self-pay

## 2020-08-11 ENCOUNTER — Ambulatory Visit (INDEPENDENT_AMBULATORY_CARE_PROVIDER_SITE_OTHER): Payer: Medicare Other | Admitting: Dermatology

## 2020-08-11 DIAGNOSIS — D485 Neoplasm of uncertain behavior of skin: Secondary | ICD-10-CM

## 2020-08-11 DIAGNOSIS — C44519 Basal cell carcinoma of skin of other part of trunk: Secondary | ICD-10-CM

## 2020-08-11 DIAGNOSIS — Z85828 Personal history of other malignant neoplasm of skin: Secondary | ICD-10-CM

## 2020-08-11 DIAGNOSIS — Z1283 Encounter for screening for malignant neoplasm of skin: Secondary | ICD-10-CM

## 2020-08-11 DIAGNOSIS — L820 Inflamed seborrheic keratosis: Secondary | ICD-10-CM | POA: Diagnosis not present

## 2020-08-11 NOTE — Patient Instructions (Signed)

## 2020-08-12 ENCOUNTER — Encounter: Payer: Self-pay | Admitting: Dermatology

## 2020-08-13 ENCOUNTER — Encounter: Payer: Self-pay | Admitting: Dermatology

## 2020-08-13 DIAGNOSIS — K529 Noninfective gastroenteritis and colitis, unspecified: Secondary | ICD-10-CM | POA: Diagnosis not present

## 2020-08-13 DIAGNOSIS — K589 Irritable bowel syndrome without diarrhea: Secondary | ICD-10-CM | POA: Diagnosis not present

## 2020-08-13 DIAGNOSIS — K5289 Other specified noninfective gastroenteritis and colitis: Secondary | ICD-10-CM | POA: Diagnosis not present

## 2020-08-14 NOTE — Progress Notes (Signed)
° °  Follow-Up Visit   Subjective  Norma Nicholson is a 74 y.o. female who presents for the following: Annual Exam (Patient here today for yearly skin check no new concerns.).  New spots of the chest and right arm Location:  Duration:  Quality:  Associated Signs/Symptoms: Modifying Factors:  Severity:  Timing: Context: History of skin cancers.  Would like general skin check.  Objective  Well appearing patient in no apparent distress; mood and affect are within normal limits. Objective  Left Top Medial Back, Right Chest: No sign residual  Objective  Head - Anterior (Face): Chest back and all sun exposed areas examined.  No atypical moles or melanoma.  Objective  Right Forearm - Anterior: Pink-tan 9 mm rough spot  Objective  Right Chest: Pink pearly 1.2cm flat papule      All sun exposed areas plus back examined.   Assessment & Plan    History of basal cell carcinoma (BCC) (2) Right Chest; Left Top Medial Back  Annual skin examination  Skin exam for malignant neoplasm Head - Anterior (Face)  Yearly skin check and encouraged to check her own skin twice annually  Neoplasm of uncertain behavior of skin Right Forearm - Anterior  Skin / nail biopsy Type of biopsy: tangential   Informed consent: discussed and consent obtained   Timeout: patient name, date of birth, surgical site, and procedure verified   Procedure prep:  Patient was prepped and draped in usual sterile fashion (Non sterile) Prep type:  Chlorhexidine Anesthesia: the lesion was anesthetized in a standard fashion   Anesthetic:  1% lidocaine w/ epinephrine 1-100,000 local infiltration Instrument used: flexible razor blade   Outcome: patient tolerated procedure well   Post-procedure details: wound care instructions given    Specimen 2 - Surgical pathology Differential Diagnosis: r/o sk  Check Margins: No  BCC (basal cell carcinoma), chest Right Chest  Destruction of lesion Complexity: simple    Destruction method: electrodesiccation and curettage   Informed consent: discussed and consent obtained   Timeout:  patient name, date of birth, surgical site, and procedure verified Anesthesia: the lesion was anesthetized in a standard fashion   Anesthetic:  1% lidocaine w/ epinephrine 1-100,000 local infiltration Curettage performed in three different directions: Yes   Curettage cycles:  3 Lesion length (cm):  1.4 Lesion width (cm):  1.2 Margin per side (cm):  0 Final wound size (cm):  1.4 Hemostasis achieved with:  aluminum chloride Outcome: patient tolerated procedure well with no complications   Post-procedure details: wound care instructions given    Specimen 1 - Surgical pathology Differential Diagnosis: bcc vs scc  Check Margins: No     I, Lavonna Monarch, MD, have reviewed all documentation for this visit.  The documentation on 08/14/20 for the exam, diagnosis, procedures, and orders are all accurate and complete.

## 2020-08-19 DIAGNOSIS — K529 Noninfective gastroenteritis and colitis, unspecified: Secondary | ICD-10-CM | POA: Diagnosis not present

## 2020-09-17 DIAGNOSIS — K589 Irritable bowel syndrome without diarrhea: Secondary | ICD-10-CM | POA: Diagnosis not present

## 2020-09-17 DIAGNOSIS — K5289 Other specified noninfective gastroenteritis and colitis: Secondary | ICD-10-CM | POA: Diagnosis not present

## 2020-09-17 DIAGNOSIS — K529 Noninfective gastroenteritis and colitis, unspecified: Secondary | ICD-10-CM | POA: Diagnosis not present

## 2020-09-25 ENCOUNTER — Other Ambulatory Visit: Payer: Self-pay | Admitting: Family Medicine

## 2020-09-25 DIAGNOSIS — Z1231 Encounter for screening mammogram for malignant neoplasm of breast: Secondary | ICD-10-CM

## 2020-09-29 DIAGNOSIS — G4700 Insomnia, unspecified: Secondary | ICD-10-CM | POA: Diagnosis not present

## 2020-09-29 DIAGNOSIS — F411 Generalized anxiety disorder: Secondary | ICD-10-CM | POA: Diagnosis not present

## 2020-09-29 DIAGNOSIS — E782 Mixed hyperlipidemia: Secondary | ICD-10-CM | POA: Diagnosis not present

## 2020-09-29 DIAGNOSIS — E559 Vitamin D deficiency, unspecified: Secondary | ICD-10-CM | POA: Diagnosis not present

## 2020-09-29 DIAGNOSIS — K529 Noninfective gastroenteritis and colitis, unspecified: Secondary | ICD-10-CM | POA: Diagnosis not present

## 2020-10-06 DIAGNOSIS — M5459 Other low back pain: Secondary | ICD-10-CM | POA: Diagnosis not present

## 2020-10-06 DIAGNOSIS — R52 Pain, unspecified: Secondary | ICD-10-CM | POA: Diagnosis not present

## 2020-10-06 DIAGNOSIS — M5412 Radiculopathy, cervical region: Secondary | ICD-10-CM | POA: Diagnosis not present

## 2020-10-29 DIAGNOSIS — E782 Mixed hyperlipidemia: Secondary | ICD-10-CM | POA: Diagnosis not present

## 2020-10-29 DIAGNOSIS — E559 Vitamin D deficiency, unspecified: Secondary | ICD-10-CM | POA: Diagnosis not present

## 2020-11-04 DIAGNOSIS — E559 Vitamin D deficiency, unspecified: Secondary | ICD-10-CM | POA: Diagnosis not present

## 2020-11-04 DIAGNOSIS — E782 Mixed hyperlipidemia: Secondary | ICD-10-CM | POA: Diagnosis not present

## 2020-11-04 DIAGNOSIS — K529 Noninfective gastroenteritis and colitis, unspecified: Secondary | ICD-10-CM | POA: Diagnosis not present

## 2020-11-04 DIAGNOSIS — M5412 Radiculopathy, cervical region: Secondary | ICD-10-CM | POA: Diagnosis not present

## 2020-11-04 DIAGNOSIS — F411 Generalized anxiety disorder: Secondary | ICD-10-CM | POA: Diagnosis not present

## 2020-11-04 DIAGNOSIS — I10 Essential (primary) hypertension: Secondary | ICD-10-CM | POA: Diagnosis not present

## 2020-11-11 ENCOUNTER — Inpatient Hospital Stay: Admission: RE | Admit: 2020-11-11 | Payer: Medicare Other | Source: Ambulatory Visit

## 2020-11-14 ENCOUNTER — Other Ambulatory Visit: Payer: Self-pay

## 2020-11-14 ENCOUNTER — Ambulatory Visit
Admission: RE | Admit: 2020-11-14 | Discharge: 2020-11-14 | Disposition: A | Discharge status: Home / Self Care | Payer: Medicare Other | Source: Ambulatory Visit | Attending: Family Medicine | Admitting: Family Medicine

## 2020-11-14 DIAGNOSIS — Z1231 Encounter for screening mammogram for malignant neoplasm of breast: Secondary | ICD-10-CM | POA: Diagnosis not present

## 2020-12-09 DIAGNOSIS — M5030 Other cervical disc degeneration, unspecified cervical region: Secondary | ICD-10-CM | POA: Diagnosis not present

## 2020-12-09 DIAGNOSIS — M5136 Other intervertebral disc degeneration, lumbar region: Secondary | ICD-10-CM | POA: Diagnosis not present

## 2020-12-17 DIAGNOSIS — G4700 Insomnia, unspecified: Secondary | ICD-10-CM | POA: Diagnosis not present

## 2020-12-17 DIAGNOSIS — F411 Generalized anxiety disorder: Secondary | ICD-10-CM | POA: Diagnosis not present

## 2020-12-17 DIAGNOSIS — F331 Major depressive disorder, recurrent, moderate: Secondary | ICD-10-CM | POA: Diagnosis not present

## 2020-12-17 DIAGNOSIS — Z23 Encounter for immunization: Secondary | ICD-10-CM | POA: Diagnosis not present

## 2020-12-17 DIAGNOSIS — K589 Irritable bowel syndrome without diarrhea: Secondary | ICD-10-CM | POA: Diagnosis not present

## 2020-12-24 ENCOUNTER — Ambulatory Visit (INDEPENDENT_AMBULATORY_CARE_PROVIDER_SITE_OTHER): Payer: Medicare Other | Admitting: Podiatry

## 2020-12-24 ENCOUNTER — Encounter: Payer: Self-pay | Admitting: Podiatry

## 2020-12-24 ENCOUNTER — Other Ambulatory Visit: Payer: Self-pay

## 2020-12-24 DIAGNOSIS — M779 Enthesopathy, unspecified: Secondary | ICD-10-CM

## 2020-12-24 DIAGNOSIS — Q828 Other specified congenital malformations of skin: Secondary | ICD-10-CM

## 2020-12-24 MED ORDER — TRIAMCINOLONE ACETONIDE 10 MG/ML IJ SUSP
10.0000 mg | Freq: Once | INTRAMUSCULAR | Status: AC
  Administered 2020-12-24: 10 mg

## 2020-12-24 NOTE — Progress Notes (Signed)
Subjective:   Patient ID: Norma Nicholson, female   DOB: 75 y.o.   MRN: [id number]   HPI Patient states painful lesions on both foot with the left being worse with fluid buildup around the joint surface that hard to walk on   ROS      Objective:  Physical Exam  Neurovascular status intact with inflammation fluid around the fifth MPJ left painful when pressed with lesion formation     Assessment:  Inflammatory capsulitis fifth MPJ porokeratosis lesion bilateral     Plan:  H&P reviewed condition sterile prep injected the fifth MPJ left 3 mg Dexasone Kenalog 5 mg Xylocaine debrided lesion bilateral reappoint to recheck again as needed for routine care

## 2021-01-07 DIAGNOSIS — Z961 Presence of intraocular lens: Secondary | ICD-10-CM | POA: Diagnosis not present

## 2021-01-07 DIAGNOSIS — H35372 Puckering of macula, left eye: Secondary | ICD-10-CM | POA: Diagnosis not present

## 2021-01-07 DIAGNOSIS — H40013 Open angle with borderline findings, low risk, bilateral: Secondary | ICD-10-CM | POA: Diagnosis not present

## 2021-01-07 DIAGNOSIS — H43813 Vitreous degeneration, bilateral: Secondary | ICD-10-CM | POA: Diagnosis not present

## 2021-01-12 DIAGNOSIS — K589 Irritable bowel syndrome without diarrhea: Secondary | ICD-10-CM | POA: Diagnosis not present

## 2021-01-12 DIAGNOSIS — K529 Noninfective gastroenteritis and colitis, unspecified: Secondary | ICD-10-CM | POA: Diagnosis not present

## 2021-01-12 DIAGNOSIS — K5289 Other specified noninfective gastroenteritis and colitis: Secondary | ICD-10-CM | POA: Diagnosis not present

## 2021-01-17 DIAGNOSIS — M2550 Pain in unspecified joint: Secondary | ICD-10-CM | POA: Diagnosis not present

## 2021-01-17 DIAGNOSIS — Z20822 Contact with and (suspected) exposure to covid-19: Secondary | ICD-10-CM | POA: Diagnosis not present

## 2021-01-17 DIAGNOSIS — H6123 Impacted cerumen, bilateral: Secondary | ICD-10-CM | POA: Diagnosis not present

## 2021-01-22 DIAGNOSIS — H6121 Impacted cerumen, right ear: Secondary | ICD-10-CM | POA: Diagnosis not present

## 2021-01-22 DIAGNOSIS — H9313 Tinnitus, bilateral: Secondary | ICD-10-CM | POA: Diagnosis not present

## 2021-02-09 DIAGNOSIS — K589 Irritable bowel syndrome without diarrhea: Secondary | ICD-10-CM | POA: Diagnosis not present

## 2021-02-09 DIAGNOSIS — K5289 Other specified noninfective gastroenteritis and colitis: Secondary | ICD-10-CM | POA: Diagnosis not present

## 2021-02-11 DIAGNOSIS — F411 Generalized anxiety disorder: Secondary | ICD-10-CM | POA: Diagnosis not present

## 2021-02-11 DIAGNOSIS — N390 Urinary tract infection, site not specified: Secondary | ICD-10-CM | POA: Diagnosis not present

## 2021-02-11 DIAGNOSIS — D649 Anemia, unspecified: Secondary | ICD-10-CM | POA: Diagnosis not present

## 2021-02-11 DIAGNOSIS — R252 Cramp and spasm: Secondary | ICD-10-CM | POA: Diagnosis not present

## 2021-02-11 DIAGNOSIS — G4700 Insomnia, unspecified: Secondary | ICD-10-CM | POA: Diagnosis not present

## 2021-02-11 DIAGNOSIS — F331 Major depressive disorder, recurrent, moderate: Secondary | ICD-10-CM | POA: Diagnosis not present

## 2021-02-11 DIAGNOSIS — K589 Irritable bowel syndrome without diarrhea: Secondary | ICD-10-CM | POA: Diagnosis not present

## 2021-02-11 DIAGNOSIS — N3000 Acute cystitis without hematuria: Secondary | ICD-10-CM | POA: Diagnosis not present

## 2021-02-12 DIAGNOSIS — H9313 Tinnitus, bilateral: Secondary | ICD-10-CM | POA: Diagnosis not present

## 2021-02-12 DIAGNOSIS — H838X3 Other specified diseases of inner ear, bilateral: Secondary | ICD-10-CM | POA: Diagnosis not present

## 2021-02-12 DIAGNOSIS — H903 Sensorineural hearing loss, bilateral: Secondary | ICD-10-CM | POA: Diagnosis not present

## 2021-02-17 DIAGNOSIS — Z1331 Encounter for screening for depression: Secondary | ICD-10-CM | POA: Diagnosis not present

## 2021-02-17 DIAGNOSIS — Z789 Other specified health status: Secondary | ICD-10-CM | POA: Diagnosis not present

## 2021-02-17 DIAGNOSIS — Z0000 Encounter for general adult medical examination without abnormal findings: Secondary | ICD-10-CM | POA: Diagnosis not present

## 2021-02-17 DIAGNOSIS — D649 Anemia, unspecified: Secondary | ICD-10-CM | POA: Diagnosis not present

## 2021-02-17 DIAGNOSIS — F411 Generalized anxiety disorder: Secondary | ICD-10-CM | POA: Diagnosis not present

## 2021-02-17 DIAGNOSIS — Z1339 Encounter for screening examination for other mental health and behavioral disorders: Secondary | ICD-10-CM | POA: Diagnosis not present

## 2021-02-19 ENCOUNTER — Ambulatory Visit (INDEPENDENT_AMBULATORY_CARE_PROVIDER_SITE_OTHER): Payer: Medicare Other | Admitting: Podiatry

## 2021-02-19 ENCOUNTER — Encounter: Payer: Self-pay | Admitting: Podiatry

## 2021-02-19 ENCOUNTER — Other Ambulatory Visit: Payer: Self-pay

## 2021-02-19 ENCOUNTER — Other Ambulatory Visit: Payer: Self-pay | Admitting: Family Medicine

## 2021-02-19 DIAGNOSIS — M21622 Bunionette of left foot: Secondary | ICD-10-CM

## 2021-02-19 DIAGNOSIS — Q828 Other specified congenital malformations of skin: Secondary | ICD-10-CM | POA: Diagnosis not present

## 2021-02-19 DIAGNOSIS — M779 Enthesopathy, unspecified: Secondary | ICD-10-CM | POA: Diagnosis not present

## 2021-02-19 DIAGNOSIS — E2839 Other primary ovarian failure: Secondary | ICD-10-CM

## 2021-02-19 NOTE — Progress Notes (Signed)
Subjective:   Patient ID: Norma Nicholson, female   DOB: 75 y.o.   MRN: [id number]   HPI Patient presents stating that she has painful lesions on both feet right worse than the left that it is really been very hard for her to walk on this left 1 and she has had orthotics in the last few years but may need a new pair.  She does try to stay active   ROS      Objective:  Physical Exam  Vascular status intact with prominence of the fifth metatarsal head left that is painful when pressed and is making it hard for her to walk with other lesions that are also thickened     Assessment:  Prominence of the fifth metatarsal head left with keratotic lesion with chronic structural instability with patient who wants to be active     Plan:  H&P reviewed all conditions.  I do think new orthotics are necessary and patient is casted for functional orthotic devices to offload the fifth metatarsal left I debrided lesions and then discussed possible surgical bunionectomy for the left fifth metatarsal which would be fifth metatarsal head resection.  Patient will be seen back to recheck

## 2021-03-12 ENCOUNTER — Ambulatory Visit (INDEPENDENT_AMBULATORY_CARE_PROVIDER_SITE_OTHER): Payer: Medicare Other | Admitting: Podiatry

## 2021-03-12 ENCOUNTER — Encounter: Payer: Self-pay | Admitting: Podiatry

## 2021-03-12 ENCOUNTER — Other Ambulatory Visit: Payer: Self-pay

## 2021-03-12 DIAGNOSIS — M21622 Bunionette of left foot: Secondary | ICD-10-CM

## 2021-03-12 DIAGNOSIS — Q828 Other specified congenital malformations of skin: Secondary | ICD-10-CM | POA: Diagnosis not present

## 2021-03-16 NOTE — Progress Notes (Signed)
Subjective:   Patient ID: Norma Nicholson, female   DOB: 75 y.o.   MRN: [id number]   HPI Patient states she still gets pain but overall has been doing pretty good with her feet and here for orthotic pickup intact   ROS      Objective:  Physical Exam  Neurovascular status was found to be within normal limits with depression of the arch noted bilateral chronic lesion and mild to moderate foot pain     Assessment:  Lesion formations foot pain along with moderate collapse of the arch     Plan:  H&P reviewed condition orthotics dispensed all instructions given I measured and watched her walk and she does appear to have a better gait pattern.  Patient will be seen back to recheck

## 2021-04-06 DIAGNOSIS — I10 Essential (primary) hypertension: Secondary | ICD-10-CM | POA: Diagnosis not present

## 2021-04-08 DIAGNOSIS — G4700 Insomnia, unspecified: Secondary | ICD-10-CM | POA: Diagnosis not present

## 2021-04-08 DIAGNOSIS — M545 Low back pain, unspecified: Secondary | ICD-10-CM | POA: Diagnosis not present

## 2021-04-08 DIAGNOSIS — F411 Generalized anxiety disorder: Secondary | ICD-10-CM | POA: Diagnosis not present

## 2021-04-08 DIAGNOSIS — Z7185 Encounter for immunization safety counseling: Secondary | ICD-10-CM | POA: Diagnosis not present

## 2021-04-08 DIAGNOSIS — E782 Mixed hyperlipidemia: Secondary | ICD-10-CM | POA: Diagnosis not present

## 2021-04-08 DIAGNOSIS — F331 Major depressive disorder, recurrent, moderate: Secondary | ICD-10-CM | POA: Diagnosis not present

## 2021-04-08 DIAGNOSIS — Z23 Encounter for immunization: Secondary | ICD-10-CM | POA: Diagnosis not present

## 2021-04-15 DIAGNOSIS — K5289 Other specified noninfective gastroenteritis and colitis: Secondary | ICD-10-CM | POA: Diagnosis not present

## 2021-04-15 DIAGNOSIS — K589 Irritable bowel syndrome without diarrhea: Secondary | ICD-10-CM | POA: Diagnosis not present

## 2021-07-06 DIAGNOSIS — E782 Mixed hyperlipidemia: Secondary | ICD-10-CM | POA: Diagnosis not present

## 2021-07-06 DIAGNOSIS — I10 Essential (primary) hypertension: Secondary | ICD-10-CM | POA: Diagnosis not present

## 2021-07-09 DIAGNOSIS — F411 Generalized anxiety disorder: Secondary | ICD-10-CM | POA: Diagnosis not present

## 2021-07-09 DIAGNOSIS — Z7185 Encounter for immunization safety counseling: Secondary | ICD-10-CM | POA: Diagnosis not present

## 2021-07-09 DIAGNOSIS — E875 Hyperkalemia: Secondary | ICD-10-CM | POA: Diagnosis not present

## 2021-07-09 DIAGNOSIS — M545 Low back pain, unspecified: Secondary | ICD-10-CM | POA: Diagnosis not present

## 2021-07-09 DIAGNOSIS — E782 Mixed hyperlipidemia: Secondary | ICD-10-CM | POA: Diagnosis not present

## 2021-07-09 DIAGNOSIS — Z23 Encounter for immunization: Secondary | ICD-10-CM | POA: Diagnosis not present

## 2021-08-06 ENCOUNTER — Ambulatory Visit
Admission: RE | Admit: 2021-08-06 | Discharge: 2021-08-06 | Disposition: A | Discharge status: Home / Self Care | Payer: Medicare Other | Source: Ambulatory Visit | Attending: Family Medicine | Admitting: Family Medicine

## 2021-08-06 DIAGNOSIS — E2839 Other primary ovarian failure: Secondary | ICD-10-CM

## 2021-08-06 DIAGNOSIS — Z780 Asymptomatic menopausal state: Secondary | ICD-10-CM | POA: Diagnosis not present

## 2021-08-11 ENCOUNTER — Other Ambulatory Visit: Payer: Self-pay

## 2021-08-11 ENCOUNTER — Ambulatory Visit (INDEPENDENT_AMBULATORY_CARE_PROVIDER_SITE_OTHER): Payer: Medicare Other | Admitting: Dermatology

## 2021-08-11 ENCOUNTER — Encounter: Payer: Self-pay | Admitting: Dermatology

## 2021-08-11 DIAGNOSIS — D485 Neoplasm of uncertain behavior of skin: Secondary | ICD-10-CM

## 2021-08-11 DIAGNOSIS — L820 Inflamed seborrheic keratosis: Secondary | ICD-10-CM | POA: Diagnosis not present

## 2021-08-11 DIAGNOSIS — C44519 Basal cell carcinoma of skin of other part of trunk: Secondary | ICD-10-CM | POA: Diagnosis not present

## 2021-08-11 DIAGNOSIS — Z1283 Encounter for screening for malignant neoplasm of skin: Secondary | ICD-10-CM | POA: Diagnosis not present

## 2021-08-11 DIAGNOSIS — L821 Other seborrheic keratosis: Secondary | ICD-10-CM

## 2021-08-11 DIAGNOSIS — D692 Other nonthrombocytopenic purpura: Secondary | ICD-10-CM | POA: Diagnosis not present

## 2021-08-11 NOTE — Patient Instructions (Signed)
Otc- Dermend for arms

## 2021-08-18 ENCOUNTER — Telehealth: Payer: Self-pay

## 2021-08-18 NOTE — Telephone Encounter (Signed)
Path to patient and results given and she already has a 30 in February

## 2021-08-18 NOTE — Telephone Encounter (Signed)
-----   Message from Lavonna Monarch, MD sent at 08/13/2021  5:56 PM EST ----- Schedule surgery with Dr. Darene Lamer

## 2021-08-19 DIAGNOSIS — I10 Essential (primary) hypertension: Secondary | ICD-10-CM | POA: Diagnosis not present

## 2021-08-25 DIAGNOSIS — K529 Noninfective gastroenteritis and colitis, unspecified: Secondary | ICD-10-CM | POA: Diagnosis not present

## 2021-08-25 DIAGNOSIS — F411 Generalized anxiety disorder: Secondary | ICD-10-CM | POA: Diagnosis not present

## 2021-08-25 DIAGNOSIS — E875 Hyperkalemia: Secondary | ICD-10-CM | POA: Diagnosis not present

## 2021-09-06 ENCOUNTER — Encounter: Payer: Self-pay | Admitting: Dermatology

## 2021-09-06 NOTE — Progress Notes (Signed)
° °  Follow-Up Visit   Subjective  Norma Nicholson is a 76 y.o. female who presents for the following: Annual Exam (No new concerns).  General skin check, check spot on leg and back Location:  Duration:  Quality:  Associated Signs/Symptoms: Modifying Factors:  Severity:  Timing: Context:   Objective  Well appearing patient in no apparent distress; mood and affect are within normal limits. General skin examination, no atypical pigmented lesions but 2 possible new nonmelanoma skin cancers leg and back will be biopsied  Chest (Upper Torso, Anterior), Scalp Textured brown flattopped papules  Left Arm, Right Arm Half dozen 1 cm ecchymoses limited to the arms; denies other abnormal bleeding  Right Upper Back Waxy 7 mm papule     Right Thigh - Anterior Crusted 7 mm macule       A full examination was performed including scalp, head, eyes, ears, nose, lips, neck, chest, axillae, abdomen, back, buttocks, bilateral upper extremities, bilateral lower extremities, hands, feet, fingers, toes, fingernails, and toenails. All findings within normal limits unless otherwise noted below.  Areas beneath undergarments not fully examined   Assessment & Plan    Screening exam for skin cancer  Annual skin examination  Seborrheic keratosis (2) Chest (Upper Torso, Anterior); Scalp  Leave if stable  Solar purpura (HCC) (2) Left Arm; Right Arm  Otc Dermend  Neoplasm of uncertain behavior of skin (2) Right Upper Back  Skin / nail biopsy Type of biopsy: tangential   Informed consent: discussed and consent obtained   Timeout: patient name, date of birth, surgical site, and procedure verified   Anesthesia: the lesion was anesthetized in a standard fashion   Anesthetic:  1% lidocaine w/ epinephrine 1-100,000 local infiltration Instrument used: flexible razor blade   Hemostasis achieved with: ferric subsulfate   Outcome: patient tolerated procedure well   Post-procedure details:  wound care instructions given    Specimen 1 - Surgical pathology Differential Diagnosis: R/O BCC VS SCC  Check Margins: No  Right Thigh - Anterior  Skin / nail biopsy Type of biopsy: tangential   Informed consent: discussed and consent obtained   Timeout: patient name, date of birth, surgical site, and procedure verified   Anesthesia: the lesion was anesthetized in a standard fashion   Anesthetic:  1% lidocaine w/ epinephrine 1-100,000 local infiltration Instrument used: flexible razor blade   Hemostasis achieved with: ferric subsulfate   Outcome: patient tolerated procedure well   Post-procedure details: wound care instructions given    Specimen 2 - Surgical pathology Differential Diagnosis: R/O BCC vs SCC  Check Margins: No      I, Lavonna Monarch, MD, have reviewed all documentation for this visit.  The documentation on 09/06/21 for the exam, diagnosis, procedures, and orders are all accurate and complete.

## 2021-09-14 DIAGNOSIS — K52831 Collagenous colitis: Secondary | ICD-10-CM | POA: Diagnosis not present

## 2021-09-14 DIAGNOSIS — D049 Carcinoma in situ of skin, unspecified: Secondary | ICD-10-CM | POA: Diagnosis not present

## 2021-09-14 DIAGNOSIS — F331 Major depressive disorder, recurrent, moderate: Secondary | ICD-10-CM | POA: Diagnosis not present

## 2021-09-14 DIAGNOSIS — G4700 Insomnia, unspecified: Secondary | ICD-10-CM | POA: Diagnosis not present

## 2021-09-14 DIAGNOSIS — F411 Generalized anxiety disorder: Secondary | ICD-10-CM | POA: Diagnosis not present

## 2021-09-18 DIAGNOSIS — N390 Urinary tract infection, site not specified: Secondary | ICD-10-CM | POA: Diagnosis not present

## 2021-09-18 DIAGNOSIS — R040 Epistaxis: Secondary | ICD-10-CM | POA: Diagnosis not present

## 2021-09-18 DIAGNOSIS — J309 Allergic rhinitis, unspecified: Secondary | ICD-10-CM | POA: Diagnosis not present

## 2021-10-08 ENCOUNTER — Other Ambulatory Visit: Payer: Self-pay

## 2021-10-08 ENCOUNTER — Other Ambulatory Visit: Payer: Self-pay | Admitting: Family Medicine

## 2021-10-08 ENCOUNTER — Ambulatory Visit (INDEPENDENT_AMBULATORY_CARE_PROVIDER_SITE_OTHER): Payer: Medicare Other | Admitting: Dermatology

## 2021-10-08 DIAGNOSIS — C44519 Basal cell carcinoma of skin of other part of trunk: Secondary | ICD-10-CM

## 2021-10-08 DIAGNOSIS — Z1231 Encounter for screening mammogram for malignant neoplasm of breast: Secondary | ICD-10-CM

## 2021-10-08 NOTE — Patient Instructions (Signed)

## 2021-10-24 ENCOUNTER — Encounter: Payer: Self-pay | Admitting: Dermatology

## 2021-10-24 NOTE — Progress Notes (Signed)
° °  Follow-Up Visit   Subjective  Norma Nicholson is a 76 y.o. female who presents for the following: Procedure (Right upper back BCC nodular).  BCC on right upper back Location:  Duration:  Quality:  Associated Signs/Symptoms: Modifying Factors:  Severity:  Timing: Context:   Objective  Well appearing patient in no apparent distress; mood and affect are within normal limits. Right Upper Back Lesion identified by Dr.Corita Allinson and nurse in room.      A focused examination was performed including back. Relevant physical exam findings are noted in the Assessment and Plan.   Assessment & Plan    Basal cell carcinoma (BCC) of back Right Upper Back  Destruction of lesion Complexity: simple   Destruction method: electrodesiccation and curettage   Informed consent: discussed and consent obtained   Timeout:  patient name, date of birth, surgical site, and procedure verified Anesthesia: the lesion was anesthetized in a standard fashion   Anesthetic:  1% lidocaine w/ epinephrine 1-100,000 local infiltration Curettage performed in three different directions: Yes   Curettage cycles:  3 Lesion length (cm):  2 Lesion width (cm):  2 Margin per side (cm):  0 Final wound size (cm):  2 Hemostasis achieved with:  ferric subsulfate Outcome: patient tolerated procedure well with no complications   Post-procedure details: sterile dressing applied and wound care instructions given   Dressing type: bandage and petrolatum   Additional details:  Wound inoculated with 5% Fluorouracil.        I, Lavonna Monarch, MD, have reviewed all documentation for this visit.  The documentation on 10/24/21 for the exam, diagnosis, procedures, and orders are all accurate and complete.

## 2021-10-26 DIAGNOSIS — M7051 Other bursitis of knee, right knee: Secondary | ICD-10-CM | POA: Diagnosis not present

## 2021-10-26 DIAGNOSIS — M1711 Unilateral primary osteoarthritis, right knee: Secondary | ICD-10-CM | POA: Diagnosis not present

## 2021-10-26 DIAGNOSIS — M25561 Pain in right knee: Secondary | ICD-10-CM | POA: Diagnosis not present

## 2021-11-02 DIAGNOSIS — I10 Essential (primary) hypertension: Secondary | ICD-10-CM | POA: Diagnosis not present

## 2021-11-02 DIAGNOSIS — E559 Vitamin D deficiency, unspecified: Secondary | ICD-10-CM | POA: Diagnosis not present

## 2021-11-02 DIAGNOSIS — E782 Mixed hyperlipidemia: Secondary | ICD-10-CM | POA: Diagnosis not present

## 2021-11-05 DIAGNOSIS — E782 Mixed hyperlipidemia: Secondary | ICD-10-CM | POA: Diagnosis not present

## 2021-11-05 DIAGNOSIS — K52831 Collagenous colitis: Secondary | ICD-10-CM | POA: Diagnosis not present

## 2021-11-05 DIAGNOSIS — F411 Generalized anxiety disorder: Secondary | ICD-10-CM | POA: Diagnosis not present

## 2021-11-05 DIAGNOSIS — E559 Vitamin D deficiency, unspecified: Secondary | ICD-10-CM | POA: Diagnosis not present

## 2021-11-09 DIAGNOSIS — M1711 Unilateral primary osteoarthritis, right knee: Secondary | ICD-10-CM | POA: Diagnosis not present

## 2021-11-16 ENCOUNTER — Ambulatory Visit
Admission: RE | Admit: 2021-11-16 | Discharge: 2021-11-16 | Disposition: A | Discharge status: Home / Self Care | Payer: Medicare Other | Source: Ambulatory Visit | Attending: Family Medicine | Admitting: Family Medicine

## 2021-11-16 DIAGNOSIS — Z1231 Encounter for screening mammogram for malignant neoplasm of breast: Secondary | ICD-10-CM

## 2021-11-16 DIAGNOSIS — M1711 Unilateral primary osteoarthritis, right knee: Secondary | ICD-10-CM | POA: Diagnosis not present

## 2021-11-16 DIAGNOSIS — K5289 Other specified noninfective gastroenteritis and colitis: Secondary | ICD-10-CM | POA: Diagnosis not present

## 2021-11-23 DIAGNOSIS — M1711 Unilateral primary osteoarthritis, right knee: Secondary | ICD-10-CM | POA: Diagnosis not present

## 2021-11-30 DIAGNOSIS — M1711 Unilateral primary osteoarthritis, right knee: Secondary | ICD-10-CM | POA: Diagnosis not present

## 2021-12-11 DIAGNOSIS — M1711 Unilateral primary osteoarthritis, right knee: Secondary | ICD-10-CM | POA: Diagnosis not present

## 2021-12-11 DIAGNOSIS — M7051 Other bursitis of knee, right knee: Secondary | ICD-10-CM | POA: Diagnosis not present

## 2021-12-23 ENCOUNTER — Encounter: Payer: Self-pay | Admitting: Podiatry

## 2021-12-23 ENCOUNTER — Ambulatory Visit (INDEPENDENT_AMBULATORY_CARE_PROVIDER_SITE_OTHER): Payer: Medicare Other | Admitting: Podiatry

## 2021-12-23 DIAGNOSIS — Q828 Other specified congenital malformations of skin: Secondary | ICD-10-CM

## 2021-12-23 DIAGNOSIS — M7752 Other enthesopathy of left foot: Secondary | ICD-10-CM

## 2021-12-23 MED ORDER — TRIAMCINOLONE ACETONIDE 10 MG/ML IJ SUSP
10.0000 mg | Freq: Once | INTRAMUSCULAR | Status: AC
  Administered 2021-12-23: 10 mg

## 2021-12-24 NOTE — Progress Notes (Signed)
Subjective:  ? ?Patient ID: Norma Nicholson, female   DOB: 76 y.o.   MRN: [id number]  ? ?HPI ?Patient presents with a painful area around the left fifth MPJ and also has several lesions on the bottom of the left foot which gets sore and make it hard to walk ? ? ?ROS ? ? ?   ?Objective:  ?Physical Exam  ?Neurovascular status intact with inflammation fluid around the fifth MPJ left painful when pressed and lesion plantar left painful when palpated ? ?   ?Assessment:  ?Inflammatory capsulitis fifth MPJ left with porokeratotic lesion third metatarsal left foot  ? ?   ?Plan:  ?Sterile prep injected the capsule 3 mg Dexasone Kenalog 5 mg Xylocaine debrided lesion no angiogenic bleeding will reappoint routine clear ?   ? ? ?

## 2022-01-05 ENCOUNTER — Encounter: Payer: Self-pay | Admitting: Dermatology

## 2022-01-05 ENCOUNTER — Ambulatory Visit (INDEPENDENT_AMBULATORY_CARE_PROVIDER_SITE_OTHER): Payer: Medicare Other | Admitting: Dermatology

## 2022-01-05 DIAGNOSIS — D485 Neoplasm of uncertain behavior of skin: Secondary | ICD-10-CM

## 2022-01-05 DIAGNOSIS — Z1283 Encounter for screening for malignant neoplasm of skin: Secondary | ICD-10-CM

## 2022-01-05 DIAGNOSIS — Z85828 Personal history of other malignant neoplasm of skin: Secondary | ICD-10-CM

## 2022-01-05 DIAGNOSIS — C44622 Squamous cell carcinoma of skin of right upper limb, including shoulder: Secondary | ICD-10-CM | POA: Diagnosis not present

## 2022-01-05 NOTE — Patient Instructions (Signed)

## 2022-01-13 DIAGNOSIS — Z961 Presence of intraocular lens: Secondary | ICD-10-CM | POA: Diagnosis not present

## 2022-01-13 DIAGNOSIS — H35372 Puckering of macula, left eye: Secondary | ICD-10-CM | POA: Diagnosis not present

## 2022-01-13 DIAGNOSIS — H40013 Open angle with borderline findings, low risk, bilateral: Secondary | ICD-10-CM | POA: Diagnosis not present

## 2022-01-13 DIAGNOSIS — H43813 Vitreous degeneration, bilateral: Secondary | ICD-10-CM | POA: Diagnosis not present

## 2022-01-22 DIAGNOSIS — R0989 Other specified symptoms and signs involving the circulatory and respiratory systems: Secondary | ICD-10-CM | POA: Diagnosis not present

## 2022-01-26 ENCOUNTER — Encounter: Payer: Self-pay | Admitting: Dermatology

## 2022-01-26 NOTE — Progress Notes (Signed)
   Follow-Up Visit   Subjective  Norma Nicholson is a 76 y.o. female who presents for the following: Follow-up (3 month f/u- bcc on neck- healing good -no concerns).  Follow-up skin cancer neck, check spot on right hand Location:  Duration:  Quality:  Associated Signs/Symptoms: Modifying Factors:  Severity:  Timing: Context:   Objective  Well appearing patient in no apparent distress; mood and affect are within normal limits. Waist up skin examination- bcc scar clear  Neck - Posterior No sign residual skin cancer  Right Dorsal Hand 7 mm waxy pink crust         A focused examination was performed including head, neck, hands. Relevant physical exam findings are noted in the Assessment and Plan.   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  Skin / nail biopsy Type of biopsy: tangential   Informed consent: discussed and consent obtained   Timeout: patient name, date of birth, surgical site, and procedure verified   Procedure prep:  Patient was prepped and draped in usual sterile fashion (Non sterile) Prep type:  Chlorhexidine Anesthesia: the lesion was anesthetized in a standard fashion   Anesthetic:  1% lidocaine w/ epinephrine 1-100,000 local infiltration Instrument used: flexible razor blade   Outcome: patient tolerated procedure well   Post-procedure details: wound care instructions given    Destruction of lesion Complexity: simple   Destruction method: electrodesiccation and curettage   Informed consent: discussed and consent obtained   Timeout:  patient name, date of birth, surgical site, and procedure verified Anesthesia: the lesion was anesthetized in a standard fashion   Anesthetic:  1% lidocaine w/ epinephrine 1-100,000 local infiltration Curettage performed in three  different directions: Yes   Curettage cycles:  3 Lesion length (cm):  0.7 Lesion width (cm):  0.7 Margin per side (cm):  0 Final wound size (cm):  0.7 Hemostasis achieved with:  aluminum chloride Outcome: patient tolerated procedure well with no complications   Post-procedure details: wound care instructions given    Specimen 1 - Surgical pathology Differential Diagnosis: bcc vs scc, wart-txpbx  Check Margins: No  After shave biopsy the base of the lesion was treated with curettage plus cautery      I, Lavonna Monarch, MD, have reviewed all documentation for this visit.  The documentation on 01/26/22 for the exam, diagnosis, procedures, and orders are all accurate and complete.

## 2022-01-27 DIAGNOSIS — N3000 Acute cystitis without hematuria: Secondary | ICD-10-CM | POA: Diagnosis not present

## 2022-01-27 DIAGNOSIS — N952 Postmenopausal atrophic vaginitis: Secondary | ICD-10-CM | POA: Diagnosis not present

## 2022-02-25 ENCOUNTER — Ambulatory Visit (INDEPENDENT_AMBULATORY_CARE_PROVIDER_SITE_OTHER): Payer: Medicare Other | Admitting: Podiatry

## 2022-02-25 ENCOUNTER — Encounter: Payer: Self-pay | Admitting: Podiatry

## 2022-02-25 DIAGNOSIS — L03031 Cellulitis of right toe: Secondary | ICD-10-CM | POA: Diagnosis not present

## 2022-02-25 DIAGNOSIS — M21622 Bunionette of left foot: Secondary | ICD-10-CM | POA: Diagnosis not present

## 2022-02-25 NOTE — Patient Instructions (Signed)

## 2022-02-26 DIAGNOSIS — Z1339 Encounter for screening examination for other mental health and behavioral disorders: Secondary | ICD-10-CM | POA: Diagnosis not present

## 2022-02-26 DIAGNOSIS — Z0000 Encounter for general adult medical examination without abnormal findings: Secondary | ICD-10-CM | POA: Diagnosis not present

## 2022-02-26 DIAGNOSIS — Z1331 Encounter for screening for depression: Secondary | ICD-10-CM | POA: Diagnosis not present

## 2022-02-26 NOTE — Progress Notes (Signed)
Subjective:   Patient ID: Norma Nicholson, female   DOB: 76 y.o.   MRN: [id number]   HPI Patient states the outside of her left foot is killing her and she is not able to walk on it and only got better for a few weeks the last time and the right big toenail has become ingrown on the inside there is been some redness and some drainage associated with it   ROS      Objective:  Physical Exam  Neurovascular status intact with patient found to have exquisite discomfort with prominent bone structure fifth metatarsal left painful when pressed and incurvated medial border right hallux with localized redness and pain no proximal edema erythema drainage noted     Assessment:  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  For the left I have recommended fifth metatarsal head resection due to the intensity of discomfort and patient wants to get this done explaining an understanding risk.  Today I went ahead and I did allow her to read consent form going over alternative treatments complications for surgery and she is willing to accept the risk and signed consent form after extensive review.  I did dispense air fracture walker for the postoperative.  And I gave her instructions on the utilization of this and fitted it properly to her leg and I want her to wear it prior to procedure so she is used to it.  For the right 1 I went ahead today and I did infiltrate the right hallux 60 mg like Marcaine mixture sterile prep done using sterile instrumentation removed the medial border cleaned out the necrotic tissue flushed and applied sterile dressing

## 2022-03-04 ENCOUNTER — Telehealth: Payer: Self-pay | Admitting: Urology

## 2022-03-04 NOTE — Telephone Encounter (Signed)
DOS - 03/30/22  METATARSAL HEAD RESECTION 5TH LEFT --- [address]  [facility] EFFECTIVE DATE - 08/23/21  PLAN DEDUCTIBLE - $150.00 W/ $0.00 REMAINING OUT OF POCKET - $1,500.00 W/ $987.21 REMAINING COINSURANCE - 20% COPAY - $0.00   PER [employer] WEB SITE FOR CPT CODE 58727 Notification or Prior Authorization is not required for the requested services  This [employer] plan does not currently require a prior authorization for these services. If you have general questions about the prior authorization requirements, please call us at [phone] or visit [email or web address] > Clinician Resources > Advance and Admission Notification Requirements. The number above acknowledges your notification. Please write this number down for future reference. Notification is not a guarantee of coverage or payment.  Decision ID #:[id number]

## 2022-03-24 DIAGNOSIS — E7800 Pure hypercholesterolemia, unspecified: Secondary | ICD-10-CM | POA: Diagnosis not present

## 2022-03-24 DIAGNOSIS — S81811A Laceration without foreign body, right lower leg, initial encounter: Secondary | ICD-10-CM | POA: Diagnosis not present

## 2022-03-24 DIAGNOSIS — D692 Other nonthrombocytopenic purpura: Secondary | ICD-10-CM | POA: Diagnosis not present

## 2022-03-24 DIAGNOSIS — Z23 Encounter for immunization: Secondary | ICD-10-CM | POA: Diagnosis not present

## 2022-03-24 DIAGNOSIS — F419 Anxiety disorder, unspecified: Secondary | ICD-10-CM | POA: Diagnosis not present

## 2022-03-24 DIAGNOSIS — H9313 Tinnitus, bilateral: Secondary | ICD-10-CM | POA: Diagnosis not present

## 2022-03-29 MED ORDER — HYDROCODONE-ACETAMINOPHEN 10-325 MG PO TABS: 1.0000 | ORAL_TABLET | Freq: Three times a day (TID) | ORAL | 0 refills | Status: AC | PRN

## 2022-03-29 NOTE — Addendum Note (Signed)
Addended by: Wallene Huh on: 03/29/2022 03:31 PM   Modules accepted: Orders

## 2022-03-30 ENCOUNTER — Encounter: Payer: Self-pay | Admitting: Podiatry

## 2022-03-30 DIAGNOSIS — M21622 Bunionette of left foot: Secondary | ICD-10-CM | POA: Diagnosis not present

## 2022-03-30 DIAGNOSIS — M2012 Hallux valgus (acquired), left foot: Secondary | ICD-10-CM | POA: Diagnosis not present

## 2022-04-02 DIAGNOSIS — R002 Palpitations: Secondary | ICD-10-CM | POA: Diagnosis not present

## 2022-04-02 DIAGNOSIS — I491 Atrial premature depolarization: Secondary | ICD-10-CM | POA: Diagnosis not present

## 2022-04-05 ENCOUNTER — Encounter: Payer: Medicare Other | Admitting: Podiatry

## 2022-04-07 ENCOUNTER — Ambulatory Visit (INDEPENDENT_AMBULATORY_CARE_PROVIDER_SITE_OTHER): Payer: Medicare Other | Admitting: Podiatry

## 2022-04-07 ENCOUNTER — Encounter: Payer: Self-pay | Admitting: Podiatry

## 2022-04-07 ENCOUNTER — Ambulatory Visit (INDEPENDENT_AMBULATORY_CARE_PROVIDER_SITE_OTHER): Payer: Medicare Other

## 2022-04-07 DIAGNOSIS — M2012 Hallux valgus (acquired), left foot: Secondary | ICD-10-CM | POA: Diagnosis not present

## 2022-04-07 DIAGNOSIS — Z9889 Other specified postprocedural states: Secondary | ICD-10-CM

## 2022-04-07 NOTE — Progress Notes (Signed)
Subjective:   Patient ID: Norma Nicholson, female   DOB: 76 y.o.   MRN: [id number]   HPI Patient presents stating she is doing well with surgery very pleased so far minimal discomfort   ROS      Objective:  Physical Exam  Neurovascular status intact negative [name]' sign noted wound edges well coapted stitches intact no pain currently     Assessment:  Doing well post fifth metatarsal head resection left     Plan:  Reapplied sterile dressing continue immobilization elevation compression dispense surgical shoe reappoint 2 weeks suture removal earlier if needed  X-rays indicate satisfactory removal head fifth metatarsal left good alignment noted

## 2022-04-21 ENCOUNTER — Ambulatory Visit (INDEPENDENT_AMBULATORY_CARE_PROVIDER_SITE_OTHER): Payer: Medicare Other | Admitting: Podiatry

## 2022-04-21 ENCOUNTER — Ambulatory Visit (INDEPENDENT_AMBULATORY_CARE_PROVIDER_SITE_OTHER): Payer: Medicare Other

## 2022-04-21 ENCOUNTER — Encounter: Payer: Self-pay | Admitting: Podiatry

## 2022-04-21 DIAGNOSIS — Z9889 Other specified postprocedural states: Secondary | ICD-10-CM | POA: Diagnosis not present

## 2022-04-21 DIAGNOSIS — M2012 Hallux valgus (acquired), left foot: Secondary | ICD-10-CM

## 2022-04-21 DIAGNOSIS — M21622 Bunionette of left foot: Secondary | ICD-10-CM

## 2022-04-21 NOTE — Progress Notes (Signed)
Subjective:   Patient ID: Norma Nicholson, female   DOB: 76 y.o.   MRN: [id number]   HPI Patient presents for suture removal states she is doing well mild swelling   ROS      Objective:  Physical Exam  Neurovascular status intact incision site healing well wound edges well coapted fifth metatarsal left     Assessment:  Doing well post fifth metatarsal head resection left     Plan:  Stitches removed x-rays reviewed patient had anklet dispensed begin gradual tennis shoe usage over the next week or 2  X-rays indicate satisfactory section of the head of the fifth metatarsal left no other pathology

## 2022-05-19 DIAGNOSIS — K5289 Other specified noninfective gastroenteritis and colitis: Secondary | ICD-10-CM | POA: Diagnosis not present

## 2022-05-19 DIAGNOSIS — T148XXA Other injury of unspecified body region, initial encounter: Secondary | ICD-10-CM | POA: Diagnosis not present

## 2022-05-19 DIAGNOSIS — K589 Irritable bowel syndrome without diarrhea: Secondary | ICD-10-CM | POA: Diagnosis not present

## 2022-05-19 DIAGNOSIS — Z79899 Other long term (current) drug therapy: Secondary | ICD-10-CM | POA: Diagnosis not present

## 2022-05-19 DIAGNOSIS — D649 Anemia, unspecified: Secondary | ICD-10-CM | POA: Diagnosis not present

## 2022-06-01 DIAGNOSIS — R051 Acute cough: Secondary | ICD-10-CM | POA: Diagnosis not present

## 2022-06-01 DIAGNOSIS — J0140 Acute pansinusitis, unspecified: Secondary | ICD-10-CM | POA: Diagnosis not present

## 2022-06-01 DIAGNOSIS — R0602 Shortness of breath: Secondary | ICD-10-CM | POA: Diagnosis not present

## 2022-06-25 DIAGNOSIS — R202 Paresthesia of skin: Secondary | ICD-10-CM | POA: Diagnosis not present

## 2022-06-25 DIAGNOSIS — R030 Elevated blood-pressure reading, without diagnosis of hypertension: Secondary | ICD-10-CM | POA: Diagnosis not present

## 2022-06-25 DIAGNOSIS — Z23 Encounter for immunization: Secondary | ICD-10-CM | POA: Diagnosis not present

## 2022-06-25 DIAGNOSIS — R600 Localized edema: Secondary | ICD-10-CM | POA: Diagnosis not present

## 2022-07-07 DIAGNOSIS — K5289 Other specified noninfective gastroenteritis and colitis: Secondary | ICD-10-CM | POA: Diagnosis not present

## 2022-07-07 DIAGNOSIS — D509 Iron deficiency anemia, unspecified: Secondary | ICD-10-CM | POA: Diagnosis not present

## 2022-07-07 DIAGNOSIS — Z79899 Other long term (current) drug therapy: Secondary | ICD-10-CM | POA: Diagnosis not present

## 2022-07-07 DIAGNOSIS — K589 Irritable bowel syndrome without diarrhea: Secondary | ICD-10-CM | POA: Diagnosis not present

## 2022-07-07 DIAGNOSIS — R197 Diarrhea, unspecified: Secondary | ICD-10-CM | POA: Diagnosis not present

## 2022-07-13 DIAGNOSIS — K2980 Duodenitis without bleeding: Secondary | ICD-10-CM | POA: Diagnosis not present

## 2022-07-13 DIAGNOSIS — K2289 Other specified disease of esophagus: Secondary | ICD-10-CM | POA: Diagnosis not present

## 2022-07-13 DIAGNOSIS — D509 Iron deficiency anemia, unspecified: Secondary | ICD-10-CM | POA: Diagnosis not present

## 2022-07-13 DIAGNOSIS — K2930 Chronic superficial gastritis without bleeding: Secondary | ICD-10-CM | POA: Diagnosis not present

## 2022-07-21 DIAGNOSIS — K2980 Duodenitis without bleeding: Secondary | ICD-10-CM | POA: Diagnosis not present

## 2022-07-21 DIAGNOSIS — K2930 Chronic superficial gastritis without bleeding: Secondary | ICD-10-CM | POA: Diagnosis not present

## 2022-08-11 DIAGNOSIS — D509 Iron deficiency anemia, unspecified: Secondary | ICD-10-CM | POA: Diagnosis not present

## 2022-08-11 DIAGNOSIS — K5289 Other specified noninfective gastroenteritis and colitis: Secondary | ICD-10-CM | POA: Diagnosis not present

## 2022-08-11 DIAGNOSIS — Z86010 Personal history of colonic polyps: Secondary | ICD-10-CM | POA: Diagnosis not present

## 2022-08-11 DIAGNOSIS — Z800 Family history of malignant neoplasm of digestive organs: Secondary | ICD-10-CM | POA: Diagnosis not present

## 2022-08-11 DIAGNOSIS — K589 Irritable bowel syndrome without diarrhea: Secondary | ICD-10-CM | POA: Diagnosis not present

## 2022-08-30 DIAGNOSIS — M7051 Other bursitis of knee, right knee: Secondary | ICD-10-CM | POA: Diagnosis not present

## 2022-08-30 DIAGNOSIS — M7631 Iliotibial band syndrome, right leg: Secondary | ICD-10-CM | POA: Diagnosis not present

## 2022-08-30 DIAGNOSIS — M1711 Unilateral primary osteoarthritis, right knee: Secondary | ICD-10-CM | POA: Diagnosis not present

## 2022-10-05 ENCOUNTER — Other Ambulatory Visit: Payer: Self-pay | Admitting: Family Medicine

## 2022-10-05 DIAGNOSIS — Z1231 Encounter for screening mammogram for malignant neoplasm of breast: Secondary | ICD-10-CM

## 2022-10-20 DIAGNOSIS — K589 Irritable bowel syndrome without diarrhea: Secondary | ICD-10-CM | POA: Diagnosis not present

## 2022-10-20 DIAGNOSIS — K5289 Other specified noninfective gastroenteritis and colitis: Secondary | ICD-10-CM | POA: Diagnosis not present

## 2022-10-20 DIAGNOSIS — D509 Iron deficiency anemia, unspecified: Secondary | ICD-10-CM | POA: Diagnosis not present

## 2022-10-20 DIAGNOSIS — Z86010 Personal history of colonic polyps: Secondary | ICD-10-CM | POA: Diagnosis not present

## 2022-10-26 DIAGNOSIS — E7800 Pure hypercholesterolemia, unspecified: Secondary | ICD-10-CM | POA: Diagnosis not present

## 2022-10-26 DIAGNOSIS — K5289 Other specified noninfective gastroenteritis and colitis: Secondary | ICD-10-CM | POA: Diagnosis not present

## 2022-10-26 DIAGNOSIS — F419 Anxiety disorder, unspecified: Secondary | ICD-10-CM | POA: Diagnosis not present

## 2022-10-26 DIAGNOSIS — Z85828 Personal history of other malignant neoplasm of skin: Secondary | ICD-10-CM | POA: Diagnosis not present

## 2022-10-26 DIAGNOSIS — Z79899 Other long term (current) drug therapy: Secondary | ICD-10-CM | POA: Diagnosis not present

## 2022-11-22 ENCOUNTER — Ambulatory Visit
Admission: RE | Admit: 2022-11-22 | Discharge: 2022-11-22 | Disposition: A | Discharge status: Home / Self Care | Payer: Medicare Other | Source: Ambulatory Visit | Attending: Family Medicine | Admitting: Family Medicine

## 2022-11-22 DIAGNOSIS — Z1231 Encounter for screening mammogram for malignant neoplasm of breast: Secondary | ICD-10-CM | POA: Diagnosis not present

## 2022-12-02 ENCOUNTER — Encounter: Payer: Self-pay | Admitting: Dermatology

## 2022-12-02 ENCOUNTER — Ambulatory Visit (INDEPENDENT_AMBULATORY_CARE_PROVIDER_SITE_OTHER): Payer: Medicare Other | Admitting: Dermatology

## 2022-12-02 VITALS — BP 116/71

## 2022-12-02 DIAGNOSIS — L578 Other skin changes due to chronic exposure to nonionizing radiation: Secondary | ICD-10-CM

## 2022-12-02 DIAGNOSIS — Z85828 Personal history of other malignant neoplasm of skin: Secondary | ICD-10-CM | POA: Diagnosis not present

## 2022-12-02 DIAGNOSIS — I781 Nevus, non-neoplastic: Secondary | ICD-10-CM | POA: Diagnosis not present

## 2022-12-02 DIAGNOSIS — X32XXXA Exposure to sunlight, initial encounter: Secondary | ICD-10-CM | POA: Diagnosis not present

## 2022-12-02 DIAGNOSIS — Z1283 Encounter for screening for malignant neoplasm of skin: Secondary | ICD-10-CM

## 2022-12-02 DIAGNOSIS — W908XXA Exposure to other nonionizing radiation, initial encounter: Secondary | ICD-10-CM | POA: Diagnosis not present

## 2022-12-02 DIAGNOSIS — L570 Actinic keratosis: Secondary | ICD-10-CM

## 2022-12-02 NOTE — Patient Instructions (Addendum)
Cryotherapy Aftercare  Wash gently with soap and water everyday.   Apply Vaseline and Band-Aid daily until healed.   Due to recent changes in healthcare laws, you may see results of your pathology and/or laboratory studies on MyChart before the doctors have had a chance to review them. We understand that in some cases there may be results that are confusing or concerning to you. Please understand that not all results are received at the same time and often the doctors may need to interpret multiple results in order to provide you with the best plan of care or course of treatment. Therefore, we ask that you please give us 2 business days to thoroughly review all your results before contacting the office for clarification. Should we see a critical lab result, you will be contacted sooner.   If You Need Anything After Your Visit  If you have any questions or concerns for your doctor, please call our main line at [phone] If no one answers, please leave a voicemail as directed and we will return your call as soon as possible. Messages left after 4 pm will be answered the following business day.   You may also send us a message via MyChart. We typically respond to MyChart messages within 1-2 business days.  For prescription refills, please ask your pharmacy to contact our office. Our fax number is [phone].  If you have an urgent issue when the clinic is closed that cannot wait until the next business day, you can page your doctor at the number below.    Please note that while we do our best to be available for urgent issues outside of office hours, we are not available 24/7.   If you have an urgent issue and are unable to reach us, you may choose to seek medical care at your doctor's office, retail clinic, urgent care center, or emergency room.  If you have a medical emergency, please immediately call 911 or go to the emergency department. In the event of inclement weather, please call our  main line at [phone] for an update on the status of any delays or closures.  Dermatology Medication Tips: Please keep the boxes that topical medications come in in order to help keep track of the instructions about where and how to use these. Pharmacies typically print the medication instructions only on the boxes and not directly on the medication tubes.   If your medication is too expensive, please contact our office at [phone] or send us a message through MyChart.   We are unable to tell what your co-pay for medications will be in advance as this is different depending on your insurance coverage. However, we may be able to find a substitute medication at lower cost or fill out paperwork to get insurance to cover a needed medication.   If a prior authorization is required to get your medication covered by your insurance company, please allow us 1-2 business days to complete this process.  Drug prices often vary depending on where the prescription is filled and some pharmacies may offer cheaper prices.  The website www.goodrx.com contains coupons for medications through different pharmacies. The prices here do not account for what the cost may be with help from insurance (it may be cheaper with your insurance), but the website can give you the price if you did not use any insurance.  - You can print the associated coupon and take it with your prescription to the pharmacy.  - You may also   stop by our office during regular business hours and pick up a GoodRx coupon card.  - If you need your prescription sent electronically to a different pharmacy, notify our office through [facility] MyChart or by phone at [phone]     

## 2022-12-02 NOTE — Progress Notes (Signed)
   New Patient Visit   Subjective  Norma Nicholson is a 77 y.o. female who presents for the following:   She has a history of SBCC on her upper back 2015.   The following portions of the chart were reviewed this encounter and updated as appropriate: medications, allergies, medical history  Review of Systems:  No other skin or systemic complaints except as noted in HPI or Assessment and Plan.  Objective  Well appearing patient in no apparent distress; mood and affect are within normal limits.  A full examination was performed including scalp, head, eyes, ears, nose, lips, neck, chest, axillae, abdomen, back, buttocks, bilateral upper extremities, bilateral lower extremities, hands, feet, fingers, toes, fingernails, and toenails. All findings within normal limits unless otherwise noted below.   Relevant exam findings are noted in the Assessment and Plan.  Head - Anterior (Face) Non blanchable superficial capillaries involving bilateral cheeks  Left Lower Leg - Anterior, right temple (3) Erythematous thin papules/macules with gritty scale.   Right Upper Back Well healed scar, no evidence of recurrence    Assessment & Plan    ACTINIC DAMAGE - Chronic condition, secondary to cumulative UV/sun exposure - diffuse scaly erythematous macules with underlying dyspigmentation - Recommend daily broad spectrum sunscreen SPF 30+ to sun-exposed areas, reapply every 2 hours as needed.  - Staying in the shade or wearing long sleeves, sun glasses (UVA+UVB protection) and wide brim hats (4-inch brim around the entire circumference of the hat) are also recommended for sun protection.  - Call for new or changing lesions.   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  Typically  we recommend at least 1-3 treatment sessions about 5-8 weeks apart for best results.  Cannot have tanned skin when BBL performed, and regular use of sunscreen is advised after the procedure to help maintain results. The patient's condition may also require "maintenance treatments" in the future.  The fee for BBL / laser treatments is $350 per treatment session for the whole face.  A fee can be quoted for other parts of the body.  Insurance typically does not pay for BBL/laser treatments and therefore the fee is an out-of-pocket cost.   Actinic keratoses (4) Left Lower Leg - Anterior; right temple (3)  Destruction of lesion - Left Lower Leg - Anterior, right temple Complexity: simple   Destruction method: cryotherapy   Informed consent: discussed and consent obtained   Timeout:  patient name, date of birth, surgical site, and procedure verified Lesion destroyed using liquid nitrogen: Yes   Outcome: patient tolerated procedure well with no complications   Post-procedure details: wound care instructions given    History of basal cell carcinoma (BCC) Right Upper Back  -Continue daily spf and yearly skin checks     Return in about 1 year (around 12/02/2023) for TBSE.  Norma Nicholson, CMA, am acting as scribe for [employer], DO.   Documentation: I have reviewed the above documentation for accuracy and completeness, and I agree with the above.  Norma Reusing, DO

## 2023-01-10 ENCOUNTER — Ambulatory Visit: Payer: Medicare Other | Admitting: Dermatology

## 2023-01-24 DIAGNOSIS — M1711 Unilateral primary osteoarthritis, right knee: Secondary | ICD-10-CM | POA: Diagnosis not present

## 2023-01-24 DIAGNOSIS — M160 Bilateral primary osteoarthritis of hip: Secondary | ICD-10-CM | POA: Diagnosis not present

## 2023-01-24 DIAGNOSIS — M545 Low back pain, unspecified: Secondary | ICD-10-CM | POA: Diagnosis not present

## 2023-01-24 DIAGNOSIS — M25551 Pain in right hip: Secondary | ICD-10-CM | POA: Diagnosis not present

## 2023-01-24 DIAGNOSIS — M25511 Pain in right shoulder: Secondary | ICD-10-CM | POA: Diagnosis not present

## 2023-01-28 DIAGNOSIS — M25511 Pain in right shoulder: Secondary | ICD-10-CM | POA: Diagnosis not present

## 2023-01-28 DIAGNOSIS — R1030 Lower abdominal pain, unspecified: Secondary | ICD-10-CM | POA: Diagnosis not present

## 2023-01-28 DIAGNOSIS — M791 Myalgia, unspecified site: Secondary | ICD-10-CM | POA: Diagnosis not present

## 2023-02-01 DIAGNOSIS — R700 Elevated erythrocyte sedimentation rate: Secondary | ICD-10-CM | POA: Diagnosis not present

## 2023-02-01 DIAGNOSIS — R52 Pain, unspecified: Secondary | ICD-10-CM | POA: Diagnosis not present

## 2023-02-01 DIAGNOSIS — R7982 Elevated C-reactive protein (CRP): Secondary | ICD-10-CM | POA: Diagnosis not present

## 2023-02-01 DIAGNOSIS — R3915 Urgency of urination: Secondary | ICD-10-CM | POA: Diagnosis not present

## 2023-02-09 DIAGNOSIS — M79642 Pain in left hand: Secondary | ICD-10-CM | POA: Diagnosis not present

## 2023-02-09 DIAGNOSIS — M25559 Pain in unspecified hip: Secondary | ICD-10-CM | POA: Diagnosis not present

## 2023-02-09 DIAGNOSIS — M549 Dorsalgia, unspecified: Secondary | ICD-10-CM | POA: Diagnosis not present

## 2023-02-09 DIAGNOSIS — M353 Polymyalgia rheumatica: Secondary | ICD-10-CM | POA: Diagnosis not present

## 2023-02-09 DIAGNOSIS — M79641 Pain in right hand: Secondary | ICD-10-CM | POA: Diagnosis not present

## 2023-02-09 DIAGNOSIS — M1990 Unspecified osteoarthritis, unspecified site: Secondary | ICD-10-CM | POA: Diagnosis not present

## 2023-02-09 DIAGNOSIS — M25511 Pain in right shoulder: Secondary | ICD-10-CM | POA: Diagnosis not present

## 2023-02-09 DIAGNOSIS — M2550 Pain in unspecified joint: Secondary | ICD-10-CM | POA: Diagnosis not present

## 2023-02-09 DIAGNOSIS — R700 Elevated erythrocyte sedimentation rate: Secondary | ICD-10-CM | POA: Diagnosis not present

## 2023-02-09 DIAGNOSIS — K5289 Other specified noninfective gastroenteritis and colitis: Secondary | ICD-10-CM | POA: Diagnosis not present

## 2023-02-09 DIAGNOSIS — M25539 Pain in unspecified wrist: Secondary | ICD-10-CM | POA: Diagnosis not present

## 2023-02-15 DIAGNOSIS — M353 Polymyalgia rheumatica: Secondary | ICD-10-CM | POA: Diagnosis not present

## 2023-02-16 DIAGNOSIS — H40013 Open angle with borderline findings, low risk, bilateral: Secondary | ICD-10-CM | POA: Diagnosis not present

## 2023-02-16 DIAGNOSIS — Z961 Presence of intraocular lens: Secondary | ICD-10-CM | POA: Diagnosis not present

## 2023-02-16 DIAGNOSIS — H35372 Puckering of macula, left eye: Secondary | ICD-10-CM | POA: Diagnosis not present

## 2023-02-16 DIAGNOSIS — H43813 Vitreous degeneration, bilateral: Secondary | ICD-10-CM | POA: Diagnosis not present

## 2023-03-08 DIAGNOSIS — F419 Anxiety disorder, unspecified: Secondary | ICD-10-CM | POA: Diagnosis not present

## 2023-03-08 DIAGNOSIS — D649 Anemia, unspecified: Secondary | ICD-10-CM | POA: Diagnosis not present

## 2023-03-08 DIAGNOSIS — M1990 Unspecified osteoarthritis, unspecified site: Secondary | ICD-10-CM | POA: Diagnosis not present

## 2023-03-08 DIAGNOSIS — Z0000 Encounter for general adult medical examination without abnormal findings: Secondary | ICD-10-CM | POA: Diagnosis not present

## 2023-03-08 DIAGNOSIS — E2839 Other primary ovarian failure: Secondary | ICD-10-CM | POA: Diagnosis not present

## 2023-03-08 DIAGNOSIS — R7303 Prediabetes: Secondary | ICD-10-CM | POA: Diagnosis not present

## 2023-03-08 DIAGNOSIS — E7800 Pure hypercholesterolemia, unspecified: Secondary | ICD-10-CM | POA: Diagnosis not present

## 2023-03-09 ENCOUNTER — Other Ambulatory Visit: Payer: Self-pay | Admitting: Family Medicine

## 2023-03-09 DIAGNOSIS — E2839 Other primary ovarian failure: Secondary | ICD-10-CM

## 2023-03-16 DIAGNOSIS — Z1589 Genetic susceptibility to other disease: Secondary | ICD-10-CM | POA: Diagnosis not present

## 2023-03-16 DIAGNOSIS — Z79899 Other long term (current) drug therapy: Secondary | ICD-10-CM | POA: Diagnosis not present

## 2023-03-16 DIAGNOSIS — K5289 Other specified noninfective gastroenteritis and colitis: Secondary | ICD-10-CM | POA: Diagnosis not present

## 2023-03-16 DIAGNOSIS — M1990 Unspecified osteoarthritis, unspecified site: Secondary | ICD-10-CM | POA: Diagnosis not present

## 2023-03-16 DIAGNOSIS — M25519 Pain in unspecified shoulder: Secondary | ICD-10-CM | POA: Diagnosis not present

## 2023-03-16 DIAGNOSIS — M353 Polymyalgia rheumatica: Secondary | ICD-10-CM | POA: Diagnosis not present

## 2023-03-16 DIAGNOSIS — M549 Dorsalgia, unspecified: Secondary | ICD-10-CM | POA: Diagnosis not present

## 2023-03-16 DIAGNOSIS — M4690 Unspecified inflammatory spondylopathy, site unspecified: Secondary | ICD-10-CM | POA: Diagnosis not present

## 2023-03-16 DIAGNOSIS — M25559 Pain in unspecified hip: Secondary | ICD-10-CM | POA: Diagnosis not present

## 2023-03-16 DIAGNOSIS — N182 Chronic kidney disease, stage 2 (mild): Secondary | ICD-10-CM | POA: Diagnosis not present

## 2023-04-29 DIAGNOSIS — Z1589 Genetic susceptibility to other disease: Secondary | ICD-10-CM | POA: Diagnosis not present

## 2023-04-29 DIAGNOSIS — M4690 Unspecified inflammatory spondylopathy, site unspecified: Secondary | ICD-10-CM | POA: Diagnosis not present

## 2023-04-29 DIAGNOSIS — M549 Dorsalgia, unspecified: Secondary | ICD-10-CM | POA: Diagnosis not present

## 2023-04-29 DIAGNOSIS — N182 Chronic kidney disease, stage 2 (mild): Secondary | ICD-10-CM | POA: Diagnosis not present

## 2023-04-29 DIAGNOSIS — M459 Ankylosing spondylitis of unspecified sites in spine: Secondary | ICD-10-CM | POA: Diagnosis not present

## 2023-04-29 DIAGNOSIS — M1990 Unspecified osteoarthritis, unspecified site: Secondary | ICD-10-CM | POA: Diagnosis not present

## 2023-04-29 DIAGNOSIS — Z79899 Other long term (current) drug therapy: Secondary | ICD-10-CM | POA: Diagnosis not present

## 2023-04-29 DIAGNOSIS — M25519 Pain in unspecified shoulder: Secondary | ICD-10-CM | POA: Diagnosis not present

## 2023-04-29 DIAGNOSIS — M25559 Pain in unspecified hip: Secondary | ICD-10-CM | POA: Diagnosis not present

## 2023-04-29 DIAGNOSIS — K5289 Other specified noninfective gastroenteritis and colitis: Secondary | ICD-10-CM | POA: Diagnosis not present

## 2023-04-29 DIAGNOSIS — Z23 Encounter for immunization: Secondary | ICD-10-CM | POA: Diagnosis not present

## 2023-05-06 ENCOUNTER — Other Ambulatory Visit: Payer: Self-pay | Admitting: Family Medicine

## 2023-05-06 DIAGNOSIS — Z1231 Encounter for screening mammogram for malignant neoplasm of breast: Secondary | ICD-10-CM

## 2023-05-20 DIAGNOSIS — R0781 Pleurodynia: Secondary | ICD-10-CM | POA: Diagnosis not present

## 2023-06-18 DIAGNOSIS — M7989 Other specified soft tissue disorders: Secondary | ICD-10-CM | POA: Diagnosis not present

## 2023-06-20 ENCOUNTER — Emergency Department (HOSPITAL_BASED_OUTPATIENT_CLINIC_OR_DEPARTMENT_OTHER): Payer: Medicare Other

## 2023-06-20 ENCOUNTER — Ambulatory Visit
Admission: RE | Admit: 2023-06-20 | Discharge: 2023-06-20 | Disposition: A | Discharge status: Home / Self Care | Payer: Medicare Other | Source: Ambulatory Visit | Attending: Family Medicine | Admitting: Family Medicine

## 2023-06-20 ENCOUNTER — Emergency Department (HOSPITAL_BASED_OUTPATIENT_CLINIC_OR_DEPARTMENT_OTHER)
Admission: EM | Admit: 2023-06-20 | Discharge: 2023-06-20 | Disposition: A | Discharge status: Home / Self Care | Payer: Medicare Other | Attending: Emergency Medicine | Admitting: Emergency Medicine

## 2023-06-20 ENCOUNTER — Other Ambulatory Visit: Payer: Self-pay | Admitting: Family Medicine

## 2023-06-20 ENCOUNTER — Encounter (HOSPITAL_BASED_OUTPATIENT_CLINIC_OR_DEPARTMENT_OTHER): Payer: Self-pay

## 2023-06-20 ENCOUNTER — Other Ambulatory Visit: Payer: Self-pay

## 2023-06-20 DIAGNOSIS — L03115 Cellulitis of right lower limb: Secondary | ICD-10-CM | POA: Diagnosis not present

## 2023-06-20 DIAGNOSIS — R0781 Pleurodynia: Secondary | ICD-10-CM

## 2023-06-20 DIAGNOSIS — N186 End stage renal disease: Secondary | ICD-10-CM | POA: Diagnosis not present

## 2023-06-20 DIAGNOSIS — R2240 Localized swelling, mass and lump, unspecified lower limb: Secondary | ICD-10-CM | POA: Diagnosis present

## 2023-06-20 DIAGNOSIS — R600 Localized edema: Secondary | ICD-10-CM | POA: Diagnosis not present

## 2023-06-20 DIAGNOSIS — Z85828 Personal history of other malignant neoplasm of skin: Secondary | ICD-10-CM | POA: Insufficient documentation

## 2023-06-20 LAB — CBC WITH DIFFERENTIAL/PLATELET
Abs Immature Granulocytes: 0.02 10*3/uL (ref 0.00–0.07)
Basophils Absolute: 0 10*3/uL (ref 0.0–0.1)
Basophils Relative: 0 %
Eosinophils Absolute: 0 10*3/uL (ref 0.0–0.5)
Eosinophils Relative: 0 %
HCT: 37 % (ref 36.0–46.0)
Hemoglobin: 11.7 g/dL — ABNORMAL LOW (ref 12.0–15.0)
Immature Granulocytes: 0 %
Lymphocytes Relative: 14 %
Lymphs Abs: 1.1 10*3/uL (ref 0.7–4.0)
MCH: 26.1 pg (ref 26.0–34.0)
MCHC: 31.6 g/dL (ref 30.0–36.0)
MCV: 82.4 fL (ref 80.0–100.0)
Monocytes Absolute: 0.9 10*3/uL (ref 0.1–1.0)
Monocytes Relative: 12 %
Neutro Abs: 5.7 10*3/uL (ref 1.7–7.7)
Neutrophils Relative %: 74 %
Platelets: 217 10*3/uL (ref 150–400)
RBC: 4.49 MIL/uL (ref 3.87–5.11)
RDW: 15.2 % (ref 11.5–15.5)
WBC: 7.8 10*3/uL (ref 4.0–10.5)
nRBC: 0 % (ref 0.0–0.2)

## 2023-06-20 LAB — COMPREHENSIVE METABOLIC PANEL
ALT: 16 U/L (ref 0–44)
AST: 24 U/L (ref 15–41)
Albumin: 4.1 g/dL (ref 3.5–5.0)
Alkaline Phosphatase: 71 U/L (ref 38–126)
Anion gap: 7 (ref 5–15)
BUN: 21 mg/dL (ref 8–23)
CO2: 29 mmol/L (ref 22–32)
Calcium: 9.9 mg/dL (ref 8.9–10.3)
Chloride: 104 mmol/L (ref 98–111)
Creatinine, Ser: 1.02 mg/dL — ABNORMAL HIGH (ref 0.44–1.00)
GFR, Estimated: 56 mL/min — ABNORMAL LOW (ref >60)
Glucose, Bld: 128 mg/dL — ABNORMAL HIGH (ref 70–99)
Potassium: 4.8 mmol/L (ref 3.5–5.1)
Sodium: 140 mmol/L (ref 135–145)
Total Bilirubin: 0.4 mg/dL (ref 0.3–1.2)
Total Protein: 6.5 g/dL (ref 6.5–8.1)

## 2023-06-20 MED ORDER — CEPHALEXIN 250 MG PO CAPS: 500.0000 mg | ORAL_CAPSULE | Freq: Once | ORAL | Status: DC

## 2023-06-20 MED ORDER — DOXYCYCLINE HYCLATE 100 MG PO TABS
100.0000 mg | ORAL_TABLET | Freq: Once | ORAL | Status: AC
  Administered 2023-06-20: 100 mg via ORAL
  Filled 2023-06-20: qty 1

## 2023-06-20 MED ORDER — DOXYCYCLINE HYCLATE 100 MG PO CAPS: 100.0000 mg | ORAL_CAPSULE | Freq: Two times a day (BID) | ORAL | 0 refills | Status: AC

## 2023-06-20 NOTE — Discharge Instructions (Signed)
You have been evaluated for your symptoms.  Fortunately ultrasound today did not show any evidence of blood clot in your leg.  Swelling and redness is likely due to cellulitis, a skin infection.  Please take antibiotic as prescribed and follow-up closely with your doctor for further care.

## 2023-06-20 NOTE — ED Triage Notes (Signed)
Pt c/o L foot/ lower leg swelling x1wk. States that "today looking at it, it's not bad, but it gets worse." Seen at [facility], advised to come here to rule out DVT, cellulitis. Pt denies fevers, additional sick symptoms

## 2023-06-20 NOTE — ED Provider Notes (Signed)
[place] EMERGENCY DEPARTMENT AT [facility] Provider Note   CSN: [id number] Arrival date & time: 06/20/23  1326     History  No chief complaint on file.   Norma Nicholson is a 77 y.o. female.  The history is provided by the patient and medical records. No language interpreter was used.        77 year old female significant history of chronic kidney disease, basal cell carcinoma, restless leg syndrome, sent here from urgent care center for evaluation of leg swelling.  Patient states she always has some swelling to her left leg but for the past week she noticed increasing swelling to the left leg which concerns her.  She does not endorse any fever or chills no chest pain or shortness of breath no significant leg pain no trauma to the leg no numbness or weakness.  Aside from resting no other specific treatment tried.  She went to urgent care today they recommend patient to come here to rule out DVT.  Home Medications Prior to Admission medications   Medication Sig Start Date End Date Taking? Authorizing Provider  budesonide (ENTOCORT EC) 3 MG 24 hr capsule Take 9 mg by mouth daily. 11/18/19   [provider]  escitalopram (LEXAPRO) 5 MG tablet Take 5 mg by mouth daily. 10/20/22   [provider]  simvastatin (ZOCOR) 20 MG tablet  12/08/13   [provider]  SODIUM FLUORIDE 5000 PPM 1.1 % PSTE Take by mouth. 06/02/20   [provider]      Allergies    Penicillins    Review of Systems   Review of Systems  All other systems reviewed and are negative.   Physical Exam Updated Vital Signs BP (!) 157/89 (BP Location: Right Arm)   Pulse 96   Temp (!) 97.1 F (36.2 C)   Resp 18   Ht 5\' 4"  (1.626 m)   Wt 77.1 kg   SpO2 96%   BMI 29.18 kg/m  Physical Exam Vitals and nursing note reviewed.  Constitutional:      General: She is not in acute distress.    Appearance: She is well-developed.  HENT:     Head: Atraumatic.  Eyes:      Conjunctiva/sclera: Conjunctivae normal.  Cardiovascular:     Rate and Rhythm: Normal rate and regular rhythm.     Pulses: Normal pulses.     Heart sounds: Normal heart sounds.  Pulmonary:     Effort: Pulmonary effort is normal.     Breath sounds: No wheezing, rhonchi or rales.  Abdominal:     Palpations: Abdomen is soft.  Musculoskeletal:     Cervical back: Neck supple.     Right lower leg: No edema.     Left lower leg: Edema (2+ pitting to bilateral lower extremity extending towards the knee with mild surrounding skin erythema and warmth.  No calf tenderness.  Intact dorsalis pedis pulse) present.  Skin:    Findings: No rash.  Neurological:     Mental Status: She is alert.  Psychiatric:        Mood and Affect: Mood normal.     ED Results / Procedures / Treatments   Labs (all labs ordered are listed, but only abnormal results are displayed) Labs Reviewed  CBC WITH DIFFERENTIAL/PLATELET - Abnormal; Notable for the following components:      Result Value   Hemoglobin 11.7 (*)    All other components within normal limits  COMPREHENSIVE METABOLIC PANEL - Abnormal; Notable  for the following components:   Glucose, Bld 128 (*)    Creatinine, Ser 1.02 (*)    GFR, Estimated 56 (*)    All other components within normal limits    EKG None  Radiology US Venous Img Lower  Left (DVT Study)  Result Date: 06/20/2023 CLINICAL DATA:  Edema for 1 week EXAM: LEFT LOWER EXTREMITY VENOUS DOPPLER ULTRASOUND TECHNIQUE: Gray-scale sonography with compression, as well as color and duplex ultrasound, were performed to evaluate the deep venous system(s) from the level of the common femoral vein through the popliteal and proximal calf veins. COMPARISON:  None Available. FINDINGS: VENOUS Normal compressibility of the common femoral, superficial femoral, and popliteal veins, as well as the visualized calf veins. Visualized portions of profunda femoral vein and great saphenous vein unremarkable. No  filling defects to suggest DVT on grayscale or color Doppler imaging. Doppler waveforms show normal direction of venous flow, normal respiratory plasticity and response to augmentation. Limited views of the contralateral common femoral vein are unremarkable. OTHER None. Limitations: none IMPRESSION: Negative. Electronically Signed   By: Minerva Fester M.D.   On: 06/20/2023 20:52   DG Chest 2 View  Result Date: 06/20/2023 CLINICAL DATA:  Left rib pain for 3 months. EXAM: CHEST - 2 VIEW COMPARISON:  None Available. FINDINGS: The heart size and mediastinal contours are within normal limits. Multiple calcified left hilar lymph nodes are noted consistent with prior granulomatous disease. Calcified granuloma seen in left lung. No acute consolidative process is noted. The visualized skeletal structures are unremarkable. IMPRESSION: No active cardiopulmonary disease. Electronically Signed   By: Lupita Raider M.D.   On: 06/20/2023 12:52    Procedures Procedures    Medications Ordered in ED Medications - No data to display  ED Course/ Medical Decision Making/ A&P                                 Medical Decision Making Amount and/or Complexity of Data Reviewed Labs: ordered.   BP (!) 169/89   Pulse 97   Temp 98.6 F (37 C) (Oral)   Resp 17   Ht 5\' 4"  (1.626 m)   Wt 77.1 kg   SpO2 99%   BMI 29.18 kg/m   26:89 PM 77 year old female significant history of chronic kidney disease, basal cell carcinoma, restless leg syndrome, sent here from urgent care center for evaluation of leg swelling.  Patient states she always has some swelling to her left leg but for the past week she noticed increasing swelling to the left leg which concerns her.  She does not endorse any fever or chills no chest pain or shortness of breath no significant leg pain no trauma to the leg no numbness or weakness.  Aside from resting no other specific treatment tried.  She went to urgent care today they recommend patient to  come here to rule out DVT.  Exam remarkable for erythema edema and warmth noted to left lower extremity with intact distal pulses.  Leg compartment is soft.  -Labs ordered, independently viewed and interpreted by me.  Labs remarkable for reassuring value -The patient was maintained on a cardiac monitor.  I personally viewed and interpreted the cardiac monitored which showed an underlying rhythm of: NSR -Imaging independently viewed and interpreted by me and I agree with radiologist's interpretation.  Result remarkable for CXR unremarkable, venous doppler US of LLE without dvt -This patient presents to the ED  for concern of leg swelling, this involves an extensive number of treatment options, and is a complaint that carries with it a high risk of complications and morbidity.  The differential diagnosis includes DVT, cellulitis, venous stasis, dermatitis, compartment syndrome, peripheral edema, abscess -Co morbidities that complicate the patient evaluation includes chronic kidney disease -Treatment includes doxycycline -Reevaluation of the patient after these medicines showed that the patient stayed the same -PCP office notes or outside notes reviewed -Discussion with specialist attending Dr. Viviann Spare -Escalation to admission/observation considered: patients feels much better, is comfortable with discharge, and will follow up with PCP -Prescription medication considered, patient comfortable with doxycycline -Social Determinant of Health considered which includes tobacco use         Final Clinical Impression(s) / ED Diagnoses Final diagnoses:  Cellulitis of right anterior lower leg    Rx / DC Orders ED Discharge Orders          Ordered    doxycycline (VIBRAMYCIN) 100 MG capsule  2 times daily        06/20/23 2109              Fayrene Helper, PA-C 06/20/23 2110    Anders Simmonds T, DO 06/21/23 2350

## 2023-07-11 DIAGNOSIS — Z800 Family history of malignant neoplasm of digestive organs: Secondary | ICD-10-CM | POA: Diagnosis not present

## 2023-07-11 DIAGNOSIS — K52831 Collagenous colitis: Secondary | ICD-10-CM | POA: Diagnosis not present

## 2023-07-12 DIAGNOSIS — R202 Paresthesia of skin: Secondary | ICD-10-CM | POA: Diagnosis not present

## 2023-07-12 DIAGNOSIS — R0789 Other chest pain: Secondary | ICD-10-CM | POA: Diagnosis not present

## 2023-07-12 DIAGNOSIS — D849 Immunodeficiency, unspecified: Secondary | ICD-10-CM | POA: Diagnosis not present

## 2023-07-12 DIAGNOSIS — M47819 Spondylosis without myelopathy or radiculopathy, site unspecified: Secondary | ICD-10-CM | POA: Diagnosis not present

## 2023-07-12 DIAGNOSIS — R600 Localized edema: Secondary | ICD-10-CM | POA: Diagnosis not present

## 2023-07-18 DIAGNOSIS — I83892 Varicose veins of left lower extremities with other complications: Secondary | ICD-10-CM | POA: Diagnosis not present

## 2023-07-18 DIAGNOSIS — R600 Localized edema: Secondary | ICD-10-CM | POA: Diagnosis not present

## 2023-08-03 DIAGNOSIS — N182 Chronic kidney disease, stage 2 (mild): Secondary | ICD-10-CM | POA: Diagnosis not present

## 2023-08-03 DIAGNOSIS — Z79899 Other long term (current) drug therapy: Secondary | ICD-10-CM | POA: Diagnosis not present

## 2023-08-03 DIAGNOSIS — M7989 Other specified soft tissue disorders: Secondary | ICD-10-CM | POA: Diagnosis not present

## 2023-08-03 DIAGNOSIS — K5289 Other specified noninfective gastroenteritis and colitis: Secondary | ICD-10-CM | POA: Diagnosis not present

## 2023-08-03 DIAGNOSIS — M459 Ankylosing spondylitis of unspecified sites in spine: Secondary | ICD-10-CM | POA: Diagnosis not present

## 2023-08-03 DIAGNOSIS — M25512 Pain in left shoulder: Secondary | ICD-10-CM | POA: Diagnosis not present

## 2023-08-03 DIAGNOSIS — M4690 Unspecified inflammatory spondylopathy, site unspecified: Secondary | ICD-10-CM | POA: Diagnosis not present

## 2023-08-03 DIAGNOSIS — M1990 Unspecified osteoarthritis, unspecified site: Secondary | ICD-10-CM | POA: Diagnosis not present

## 2023-08-03 DIAGNOSIS — Z1589 Genetic susceptibility to other disease: Secondary | ICD-10-CM | POA: Diagnosis not present

## 2023-08-03 DIAGNOSIS — M549 Dorsalgia, unspecified: Secondary | ICD-10-CM | POA: Diagnosis not present

## 2023-08-11 DIAGNOSIS — F321 Major depressive disorder, single episode, moderate: Secondary | ICD-10-CM | POA: Diagnosis not present

## 2023-08-11 DIAGNOSIS — G479 Sleep disorder, unspecified: Secondary | ICD-10-CM | POA: Diagnosis not present

## 2023-08-11 DIAGNOSIS — Z7184 Encounter for health counseling related to travel: Secondary | ICD-10-CM | POA: Diagnosis not present

## 2023-08-11 DIAGNOSIS — M059 Rheumatoid arthritis with rheumatoid factor, unspecified: Secondary | ICD-10-CM | POA: Diagnosis not present

## 2023-08-23 DIAGNOSIS — M79601 Pain in right arm: Secondary | ICD-10-CM | POA: Diagnosis not present

## 2023-08-23 DIAGNOSIS — M25511 Pain in right shoulder: Secondary | ICD-10-CM | POA: Diagnosis not present

## 2023-08-23 DIAGNOSIS — M25512 Pain in left shoulder: Secondary | ICD-10-CM | POA: Diagnosis not present

## 2023-08-29 DIAGNOSIS — R600 Localized edema: Secondary | ICD-10-CM | POA: Diagnosis not present

## 2023-08-30 DIAGNOSIS — J984 Other disorders of lung: Secondary | ICD-10-CM | POA: Diagnosis not present

## 2023-09-13 DIAGNOSIS — M459 Ankylosing spondylitis of unspecified sites in spine: Secondary | ICD-10-CM | POA: Diagnosis not present

## 2023-09-13 DIAGNOSIS — G4730 Sleep apnea, unspecified: Secondary | ICD-10-CM | POA: Diagnosis not present

## 2023-09-13 DIAGNOSIS — F321 Major depressive disorder, single episode, moderate: Secondary | ICD-10-CM | POA: Diagnosis not present

## 2023-09-13 DIAGNOSIS — E7800 Pure hypercholesterolemia, unspecified: Secondary | ICD-10-CM | POA: Diagnosis not present

## 2023-09-15 DIAGNOSIS — M25511 Pain in right shoulder: Secondary | ICD-10-CM | POA: Diagnosis not present

## 2023-09-15 DIAGNOSIS — M79601 Pain in right arm: Secondary | ICD-10-CM | POA: Diagnosis not present

## 2023-09-26 DIAGNOSIS — M25511 Pain in right shoulder: Secondary | ICD-10-CM | POA: Diagnosis not present

## 2023-10-03 DIAGNOSIS — F5104 Psychophysiologic insomnia: Secondary | ICD-10-CM | POA: Diagnosis not present

## 2023-10-03 DIAGNOSIS — R0681 Apnea, not elsewhere classified: Secondary | ICD-10-CM | POA: Diagnosis not present

## 2023-10-03 DIAGNOSIS — R0683 Snoring: Secondary | ICD-10-CM | POA: Diagnosis not present

## 2023-10-03 DIAGNOSIS — G2581 Restless legs syndrome: Secondary | ICD-10-CM | POA: Diagnosis not present

## 2023-10-03 DIAGNOSIS — F419 Anxiety disorder, unspecified: Secondary | ICD-10-CM | POA: Diagnosis not present

## 2023-10-03 DIAGNOSIS — R011 Cardiac murmur, unspecified: Secondary | ICD-10-CM | POA: Diagnosis not present

## 2023-10-03 DIAGNOSIS — D509 Iron deficiency anemia, unspecified: Secondary | ICD-10-CM | POA: Diagnosis not present

## 2023-10-05 DIAGNOSIS — D509 Iron deficiency anemia, unspecified: Secondary | ICD-10-CM | POA: Diagnosis not present

## 2023-10-05 DIAGNOSIS — R0681 Apnea, not elsewhere classified: Secondary | ICD-10-CM | POA: Diagnosis not present

## 2023-10-16 DIAGNOSIS — B349 Viral infection, unspecified: Secondary | ICD-10-CM | POA: Diagnosis not present

## 2023-10-16 DIAGNOSIS — Z20822 Contact with and (suspected) exposure to covid-19: Secondary | ICD-10-CM | POA: Diagnosis not present

## 2023-10-21 DIAGNOSIS — J40 Bronchitis, not specified as acute or chronic: Secondary | ICD-10-CM | POA: Diagnosis not present

## 2023-10-21 DIAGNOSIS — R000 Tachycardia, unspecified: Secondary | ICD-10-CM | POA: Diagnosis not present

## 2023-10-24 DIAGNOSIS — R053 Chronic cough: Secondary | ICD-10-CM | POA: Diagnosis not present

## 2023-10-31 DIAGNOSIS — R011 Cardiac murmur, unspecified: Secondary | ICD-10-CM | POA: Diagnosis not present

## 2023-11-02 DIAGNOSIS — M1990 Unspecified osteoarthritis, unspecified site: Secondary | ICD-10-CM | POA: Diagnosis not present

## 2023-11-02 DIAGNOSIS — Z1589 Genetic susceptibility to other disease: Secondary | ICD-10-CM | POA: Diagnosis not present

## 2023-11-02 DIAGNOSIS — M25551 Pain in right hip: Secondary | ICD-10-CM | POA: Diagnosis not present

## 2023-11-02 DIAGNOSIS — M459 Ankylosing spondylitis of unspecified sites in spine: Secondary | ICD-10-CM | POA: Diagnosis not present

## 2023-11-02 DIAGNOSIS — M4690 Unspecified inflammatory spondylopathy, site unspecified: Secondary | ICD-10-CM | POA: Diagnosis not present

## 2023-11-02 DIAGNOSIS — M7060 Trochanteric bursitis, unspecified hip: Secondary | ICD-10-CM | POA: Diagnosis not present

## 2023-11-02 DIAGNOSIS — N182 Chronic kidney disease, stage 2 (mild): Secondary | ICD-10-CM | POA: Diagnosis not present

## 2023-11-02 DIAGNOSIS — M549 Dorsalgia, unspecified: Secondary | ICD-10-CM | POA: Diagnosis not present

## 2023-11-02 DIAGNOSIS — K5289 Other specified noninfective gastroenteritis and colitis: Secondary | ICD-10-CM | POA: Diagnosis not present

## 2023-11-02 DIAGNOSIS — Z79899 Other long term (current) drug therapy: Secondary | ICD-10-CM | POA: Diagnosis not present

## 2023-11-07 DIAGNOSIS — G4733 Obstructive sleep apnea (adult) (pediatric): Secondary | ICD-10-CM | POA: Diagnosis not present

## 2023-11-09 DIAGNOSIS — Z860101 Personal history of adenomatous and serrated colon polyps: Secondary | ICD-10-CM | POA: Diagnosis not present

## 2023-11-09 DIAGNOSIS — Z800 Family history of malignant neoplasm of digestive organs: Secondary | ICD-10-CM | POA: Diagnosis not present

## 2023-11-10 DIAGNOSIS — G2581 Restless legs syndrome: Secondary | ICD-10-CM | POA: Diagnosis not present

## 2023-11-10 DIAGNOSIS — G4733 Obstructive sleep apnea (adult) (pediatric): Secondary | ICD-10-CM | POA: Diagnosis not present

## 2023-11-28 ENCOUNTER — Ambulatory Visit
Admission: RE | Admit: 2023-11-28 | Discharge: 2023-11-28 | Disposition: A | Discharge status: Home / Self Care | Payer: Medicare Other | Source: Ambulatory Visit | Attending: Family Medicine | Admitting: Family Medicine

## 2023-11-28 DIAGNOSIS — N958 Other specified menopausal and perimenopausal disorders: Secondary | ICD-10-CM | POA: Diagnosis not present

## 2023-11-28 DIAGNOSIS — Z1231 Encounter for screening mammogram for malignant neoplasm of breast: Secondary | ICD-10-CM

## 2023-11-28 DIAGNOSIS — E2839 Other primary ovarian failure: Secondary | ICD-10-CM

## 2023-11-28 DIAGNOSIS — Z780 Asymptomatic menopausal state: Secondary | ICD-10-CM | POA: Diagnosis not present

## 2023-11-29 DIAGNOSIS — E7800 Pure hypercholesterolemia, unspecified: Secondary | ICD-10-CM | POA: Diagnosis not present

## 2023-11-29 DIAGNOSIS — G4733 Obstructive sleep apnea (adult) (pediatric): Secondary | ICD-10-CM | POA: Diagnosis not present

## 2023-11-29 DIAGNOSIS — Z6827 Body mass index (BMI) 27.0-27.9, adult: Secondary | ICD-10-CM | POA: Diagnosis not present

## 2023-11-29 DIAGNOSIS — F419 Anxiety disorder, unspecified: Secondary | ICD-10-CM | POA: Diagnosis not present

## 2023-11-29 DIAGNOSIS — R7303 Prediabetes: Secondary | ICD-10-CM | POA: Diagnosis not present

## 2023-11-29 DIAGNOSIS — Z1331 Encounter for screening for depression: Secondary | ICD-10-CM | POA: Diagnosis not present

## 2023-11-29 DIAGNOSIS — E559 Vitamin D deficiency, unspecified: Secondary | ICD-10-CM | POA: Diagnosis not present

## 2023-11-29 DIAGNOSIS — E663 Overweight: Secondary | ICD-10-CM | POA: Diagnosis not present

## 2023-12-12 DIAGNOSIS — R7303 Prediabetes: Secondary | ICD-10-CM | POA: Diagnosis not present

## 2023-12-12 DIAGNOSIS — E663 Overweight: Secondary | ICD-10-CM | POA: Diagnosis not present

## 2023-12-12 DIAGNOSIS — E559 Vitamin D deficiency, unspecified: Secondary | ICD-10-CM | POA: Diagnosis not present

## 2023-12-12 DIAGNOSIS — E7800 Pure hypercholesterolemia, unspecified: Secondary | ICD-10-CM | POA: Diagnosis not present

## 2023-12-12 DIAGNOSIS — Z6827 Body mass index (BMI) 27.0-27.9, adult: Secondary | ICD-10-CM | POA: Diagnosis not present

## 2023-12-12 DIAGNOSIS — G4733 Obstructive sleep apnea (adult) (pediatric): Secondary | ICD-10-CM | POA: Diagnosis not present

## 2023-12-14 DIAGNOSIS — Z09 Encounter for follow-up examination after completed treatment for conditions other than malignant neoplasm: Secondary | ICD-10-CM | POA: Diagnosis not present

## 2023-12-14 DIAGNOSIS — D125 Benign neoplasm of sigmoid colon: Secondary | ICD-10-CM | POA: Diagnosis not present

## 2023-12-14 DIAGNOSIS — Z860101 Personal history of adenomatous and serrated colon polyps: Secondary | ICD-10-CM | POA: Diagnosis not present

## 2023-12-14 DIAGNOSIS — Z800 Family history of malignant neoplasm of digestive organs: Secondary | ICD-10-CM | POA: Diagnosis not present

## 2023-12-14 DIAGNOSIS — K5730 Diverticulosis of large intestine without perforation or abscess without bleeding: Secondary | ICD-10-CM | POA: Diagnosis not present

## 2023-12-19 DIAGNOSIS — H4601 Optic papillitis, right eye: Secondary | ICD-10-CM | POA: Diagnosis not present

## 2023-12-21 DIAGNOSIS — M459 Ankylosing spondylitis of unspecified sites in spine: Secondary | ICD-10-CM | POA: Diagnosis not present

## 2023-12-21 DIAGNOSIS — M059 Rheumatoid arthritis with rheumatoid factor, unspecified: Secondary | ICD-10-CM | POA: Diagnosis not present

## 2023-12-21 DIAGNOSIS — E7800 Pure hypercholesterolemia, unspecified: Secondary | ICD-10-CM | POA: Diagnosis not present

## 2023-12-21 DIAGNOSIS — M47819 Spondylosis without myelopathy or radiculopathy, site unspecified: Secondary | ICD-10-CM | POA: Diagnosis not present

## 2023-12-28 DIAGNOSIS — H40013 Open angle with borderline findings, low risk, bilateral: Secondary | ICD-10-CM | POA: Diagnosis not present

## 2023-12-28 DIAGNOSIS — H4601 Optic papillitis, right eye: Secondary | ICD-10-CM | POA: Diagnosis not present

## 2023-12-28 DIAGNOSIS — H53411 Scotoma involving central area, right eye: Secondary | ICD-10-CM | POA: Diagnosis not present

## 2023-12-29 DIAGNOSIS — H4601 Optic papillitis, right eye: Secondary | ICD-10-CM | POA: Diagnosis not present

## 2024-01-02 DIAGNOSIS — E663 Overweight: Secondary | ICD-10-CM | POA: Diagnosis not present

## 2024-01-02 DIAGNOSIS — Z6826 Body mass index (BMI) 26.0-26.9, adult: Secondary | ICD-10-CM | POA: Diagnosis not present

## 2024-01-02 DIAGNOSIS — E7800 Pure hypercholesterolemia, unspecified: Secondary | ICD-10-CM | POA: Diagnosis not present

## 2024-01-02 DIAGNOSIS — E559 Vitamin D deficiency, unspecified: Secondary | ICD-10-CM | POA: Diagnosis not present

## 2024-01-02 DIAGNOSIS — G4733 Obstructive sleep apnea (adult) (pediatric): Secondary | ICD-10-CM | POA: Diagnosis not present

## 2024-01-02 DIAGNOSIS — R7303 Prediabetes: Secondary | ICD-10-CM | POA: Diagnosis not present

## 2024-01-17 DIAGNOSIS — E7800 Pure hypercholesterolemia, unspecified: Secondary | ICD-10-CM | POA: Diagnosis not present

## 2024-01-17 DIAGNOSIS — R7303 Prediabetes: Secondary | ICD-10-CM | POA: Diagnosis not present

## 2024-01-17 DIAGNOSIS — E663 Overweight: Secondary | ICD-10-CM | POA: Diagnosis not present

## 2024-01-17 DIAGNOSIS — Z6826 Body mass index (BMI) 26.0-26.9, adult: Secondary | ICD-10-CM | POA: Diagnosis not present

## 2024-01-17 DIAGNOSIS — G4733 Obstructive sleep apnea (adult) (pediatric): Secondary | ICD-10-CM | POA: Diagnosis not present

## 2024-01-21 DIAGNOSIS — M459 Ankylosing spondylitis of unspecified sites in spine: Secondary | ICD-10-CM | POA: Diagnosis not present

## 2024-01-21 DIAGNOSIS — M47819 Spondylosis without myelopathy or radiculopathy, site unspecified: Secondary | ICD-10-CM | POA: Diagnosis not present

## 2024-01-21 DIAGNOSIS — E7800 Pure hypercholesterolemia, unspecified: Secondary | ICD-10-CM | POA: Diagnosis not present

## 2024-01-21 DIAGNOSIS — M059 Rheumatoid arthritis with rheumatoid factor, unspecified: Secondary | ICD-10-CM | POA: Diagnosis not present

## 2024-02-02 DIAGNOSIS — M459 Ankylosing spondylitis of unspecified sites in spine: Secondary | ICD-10-CM | POA: Diagnosis not present

## 2024-02-02 DIAGNOSIS — K5289 Other specified noninfective gastroenteritis and colitis: Secondary | ICD-10-CM | POA: Diagnosis not present

## 2024-02-02 DIAGNOSIS — M4690 Unspecified inflammatory spondylopathy, site unspecified: Secondary | ICD-10-CM | POA: Diagnosis not present

## 2024-02-02 DIAGNOSIS — N182 Chronic kidney disease, stage 2 (mild): Secondary | ICD-10-CM | POA: Diagnosis not present

## 2024-02-02 DIAGNOSIS — M1990 Unspecified osteoarthritis, unspecified site: Secondary | ICD-10-CM | POA: Diagnosis not present

## 2024-02-02 DIAGNOSIS — M7060 Trochanteric bursitis, unspecified hip: Secondary | ICD-10-CM | POA: Diagnosis not present

## 2024-02-02 DIAGNOSIS — M25551 Pain in right hip: Secondary | ICD-10-CM | POA: Diagnosis not present

## 2024-02-02 DIAGNOSIS — Z79899 Other long term (current) drug therapy: Secondary | ICD-10-CM | POA: Diagnosis not present

## 2024-02-02 DIAGNOSIS — M549 Dorsalgia, unspecified: Secondary | ICD-10-CM | POA: Diagnosis not present

## 2024-02-02 DIAGNOSIS — Z1589 Genetic susceptibility to other disease: Secondary | ICD-10-CM | POA: Diagnosis not present

## 2024-02-03 DIAGNOSIS — H4600 Optic papillitis, unspecified eye: Secondary | ICD-10-CM | POA: Diagnosis not present

## 2024-02-06 DIAGNOSIS — K5289 Other specified noninfective gastroenteritis and colitis: Secondary | ICD-10-CM | POA: Diagnosis not present

## 2024-02-06 DIAGNOSIS — R195 Other fecal abnormalities: Secondary | ICD-10-CM | POA: Diagnosis not present

## 2024-02-07 ENCOUNTER — Ambulatory Visit (INDEPENDENT_AMBULATORY_CARE_PROVIDER_SITE_OTHER): Admitting: Family Medicine

## 2024-02-07 ENCOUNTER — Encounter: Payer: Self-pay | Admitting: Family Medicine

## 2024-02-07 VITALS — BP 120/68 | Ht 65.0 in | Wt 157.0 lb

## 2024-02-07 DIAGNOSIS — M7631 Iliotibial band syndrome, right leg: Secondary | ICD-10-CM

## 2024-02-07 DIAGNOSIS — M25559 Pain in unspecified hip: Secondary | ICD-10-CM | POA: Diagnosis not present

## 2024-02-07 DIAGNOSIS — M7632 Iliotibial band syndrome, left leg: Secondary | ICD-10-CM

## 2024-02-07 MED ORDER — MELOXICAM 15 MG PO TABS: ORAL_TABLET | ORAL | 0 refills | Status: AC

## 2024-02-07 NOTE — Progress Notes (Addendum)
 PCP: Dorena Gander, MD  Subjective:   HPI: Patient is a 78 y.o. female here for bilateral leg pain.  Najmo states that 1 month ago she began having right lateral leg pain and soon after developed left lateral leg pain. Her pain is worst with standing and impedes her ability to walk. Has been exercising two times per week but states that she has had to limit her activity due to pain. She does have a history of osteoarthritis of the hips, however she states she has never had symptoms and that her pain does not radiate into the groin. She also has a history of ankylosing spondylitis, however she states she is not having any back pain, numbness, tingling, burning. She does endorse some weakness in the legs however this has been a chronic challenge. She has no history of acute trauma to the hip.   Past Medical History:  Diagnosis Date   Benign paroxysmal vertigo    Chronic kidney disease, stage 2 (mild)    Collagenous colitis    Hyperlipidemia    Restless leg syndrome    Superficial basal cell carcinoma (BCC) 03/19/2009   Right Chest (curet and 5FU)   Superficial basal cell carcinoma (BCC) 02/04/2014   Left Top Medial Back (curet and 5FU)    Current Outpatient Medications on File Prior to Visit  Medication Sig Dispense Refill   budesonide (ENTOCORT EC) 3 MG 24 hr capsule Take 9 mg by mouth daily.     doxycycline  (VIBRAMYCIN ) 100 MG capsule Take 1 capsule (100 mg total) by mouth 2 (two) times daily. 20 capsule 0   escitalopram (LEXAPRO) 5 MG tablet Take 5 mg by mouth daily.     simvastatin (ZOCOR) 20 MG tablet      SODIUM FLUORIDE 5000 PPM 1.1 % PSTE Take by mouth.     No current facility-administered medications on file prior to visit.    Past Surgical History:  Procedure Laterality Date   BREAST BIOPSY Left 02/16/2005   BUNIONECTOMY  2000   FINGER SURGERY      Allergies  Allergen Reactions   Penicillins     BP 120/68   Ht 5' 5 (1.651 m)   Wt 157 lb (71.2 kg)   BMI 26.13  kg/m       No data to display              No data to display              Objective:  Physical Exam:  Gen: NAD, comfortable in exam room  MSK: Hip Inspection: No bruising, swelling, erythema Palpation: Tender to IT band, posterior greater trochanter bilaterally. No tenderness along lateral greater trochanter knee.  ROM: Flexion full Strength: Flexion 5/5, Extension 5/5, Abduction 5/5,  Special Tests: Log roll negative, FABER positive bilaterally, FADIR positive bilaterally, SI Detraction negative, Ober positive, Noble positive bilaterally   Assessment & Plan:  Patient is a 78 y.o. female here for bilateral leg pain. 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 Low suspicion this is hip arthritis given her bilateral symptoms and lateral pain. Also low suspicion her pain is due to ankylosing spondylitis as it does not originate from the spine and is not radicular in nature.   1. IT Band Syndrome  Greater Trochanter Syndrome  - Meloxicam 14 days followed by PRN - Exercises to strengthen abductors and stretch IT band - Avoid other exercises  that instigate pain - Ice as needed, especially over greater trochanter and Gerdy's tubercle - Will follow-up in 1 month or as needed    Elnoria Hails, [facility]  I saw and evaluated the patient independently of the medical student Verdie Gladden, I agree with his assessment and plan.  Patient presenting with likely multifactorial pathology, greater trochanteric pain syndrome as well as IT band syndrome.  Will go ahead and do anti-inflammatories at this time, main goal is to decrease any inflammatory process that may be occurring while also allowing patient to start strengthening and doing rehab.  Patient was given exercises to do while at home..  Patient to follow-up in 4 to 6 weeks and we will reevaluate at that time.   Dotti Busey  MD

## 2024-02-08 DIAGNOSIS — R195 Other fecal abnormalities: Secondary | ICD-10-CM | POA: Diagnosis not present

## 2024-02-08 NOTE — Addendum Note (Signed)
 Addended by: Rodgers Clack on: 02/08/2024 11:56 AM   Modules accepted: Level of Service

## 2024-02-14 DIAGNOSIS — A498 Other bacterial infections of unspecified site: Secondary | ICD-10-CM | POA: Diagnosis not present

## 2024-02-14 DIAGNOSIS — E7800 Pure hypercholesterolemia, unspecified: Secondary | ICD-10-CM | POA: Diagnosis not present

## 2024-02-14 DIAGNOSIS — Z6825 Body mass index (BMI) 25.0-25.9, adult: Secondary | ICD-10-CM | POA: Diagnosis not present

## 2024-02-14 DIAGNOSIS — G4733 Obstructive sleep apnea (adult) (pediatric): Secondary | ICD-10-CM | POA: Diagnosis not present

## 2024-02-14 DIAGNOSIS — E663 Overweight: Secondary | ICD-10-CM | POA: Diagnosis not present

## 2024-02-14 DIAGNOSIS — R7303 Prediabetes: Secondary | ICD-10-CM | POA: Diagnosis not present

## 2024-02-17 DIAGNOSIS — Z961 Presence of intraocular lens: Secondary | ICD-10-CM | POA: Diagnosis not present

## 2024-02-17 DIAGNOSIS — H35372 Puckering of macula, left eye: Secondary | ICD-10-CM | POA: Diagnosis not present

## 2024-02-17 DIAGNOSIS — H47011 Ischemic optic neuropathy, right eye: Secondary | ICD-10-CM | POA: Diagnosis not present

## 2024-02-17 DIAGNOSIS — H53411 Scotoma involving central area, right eye: Secondary | ICD-10-CM | POA: Diagnosis not present

## 2024-02-17 DIAGNOSIS — H40013 Open angle with borderline findings, low risk, bilateral: Secondary | ICD-10-CM | POA: Diagnosis not present

## 2024-02-20 DIAGNOSIS — E7800 Pure hypercholesterolemia, unspecified: Secondary | ICD-10-CM | POA: Diagnosis not present

## 2024-02-20 DIAGNOSIS — M47819 Spondylosis without myelopathy or radiculopathy, site unspecified: Secondary | ICD-10-CM | POA: Diagnosis not present

## 2024-02-20 DIAGNOSIS — M059 Rheumatoid arthritis with rheumatoid factor, unspecified: Secondary | ICD-10-CM | POA: Diagnosis not present

## 2024-02-20 DIAGNOSIS — M459 Ankylosing spondylitis of unspecified sites in spine: Secondary | ICD-10-CM | POA: Diagnosis not present

## 2024-03-07 DIAGNOSIS — M25511 Pain in right shoulder: Secondary | ICD-10-CM | POA: Diagnosis not present

## 2024-03-07 DIAGNOSIS — M25512 Pain in left shoulder: Secondary | ICD-10-CM | POA: Diagnosis not present

## 2024-03-19 ENCOUNTER — Ambulatory Visit: Admitting: Family Medicine

## 2024-03-21 DIAGNOSIS — R251 Tremor, unspecified: Secondary | ICD-10-CM | POA: Diagnosis not present

## 2024-03-21 DIAGNOSIS — Z0000 Encounter for general adult medical examination without abnormal findings: Secondary | ICD-10-CM | POA: Diagnosis not present

## 2024-03-21 DIAGNOSIS — E7800 Pure hypercholesterolemia, unspecified: Secondary | ICD-10-CM | POA: Diagnosis not present

## 2024-03-21 DIAGNOSIS — E538 Deficiency of other specified B group vitamins: Secondary | ICD-10-CM | POA: Diagnosis not present

## 2024-03-21 DIAGNOSIS — R7303 Prediabetes: Secondary | ICD-10-CM | POA: Diagnosis not present

## 2024-03-21 DIAGNOSIS — D509 Iron deficiency anemia, unspecified: Secondary | ICD-10-CM | POA: Diagnosis not present

## 2024-03-21 DIAGNOSIS — E559 Vitamin D deficiency, unspecified: Secondary | ICD-10-CM | POA: Diagnosis not present

## 2024-03-21 DIAGNOSIS — F419 Anxiety disorder, unspecified: Secondary | ICD-10-CM | POA: Diagnosis not present

## 2024-03-22 DIAGNOSIS — M059 Rheumatoid arthritis with rheumatoid factor, unspecified: Secondary | ICD-10-CM | POA: Diagnosis not present

## 2024-03-22 DIAGNOSIS — E7800 Pure hypercholesterolemia, unspecified: Secondary | ICD-10-CM | POA: Diagnosis not present

## 2024-03-22 DIAGNOSIS — M459 Ankylosing spondylitis of unspecified sites in spine: Secondary | ICD-10-CM | POA: Diagnosis not present

## 2024-03-22 DIAGNOSIS — M47819 Spondylosis without myelopathy or radiculopathy, site unspecified: Secondary | ICD-10-CM | POA: Diagnosis not present

## 2024-03-27 DIAGNOSIS — R7303 Prediabetes: Secondary | ICD-10-CM | POA: Diagnosis not present

## 2024-03-27 DIAGNOSIS — E7800 Pure hypercholesterolemia, unspecified: Secondary | ICD-10-CM | POA: Diagnosis not present

## 2024-03-27 DIAGNOSIS — Z6825 Body mass index (BMI) 25.0-25.9, adult: Secondary | ICD-10-CM | POA: Diagnosis not present

## 2024-03-27 DIAGNOSIS — G4733 Obstructive sleep apnea (adult) (pediatric): Secondary | ICD-10-CM | POA: Diagnosis not present

## 2024-03-27 DIAGNOSIS — E663 Overweight: Secondary | ICD-10-CM | POA: Diagnosis not present

## 2024-04-04 DIAGNOSIS — H348312 Tributary (branch) retinal vein occlusion, right eye, stable: Secondary | ICD-10-CM | POA: Diagnosis not present

## 2024-04-04 DIAGNOSIS — H5340 Unspecified visual field defects: Secondary | ICD-10-CM | POA: Diagnosis not present

## 2024-04-04 DIAGNOSIS — Z961 Presence of intraocular lens: Secondary | ICD-10-CM | POA: Diagnosis not present

## 2024-04-22 DIAGNOSIS — E7800 Pure hypercholesterolemia, unspecified: Secondary | ICD-10-CM | POA: Diagnosis not present

## 2024-04-22 DIAGNOSIS — M059 Rheumatoid arthritis with rheumatoid factor, unspecified: Secondary | ICD-10-CM | POA: Diagnosis not present

## 2024-04-22 DIAGNOSIS — M47819 Spondylosis without myelopathy or radiculopathy, site unspecified: Secondary | ICD-10-CM | POA: Diagnosis not present

## 2024-04-22 DIAGNOSIS — M459 Ankylosing spondylitis of unspecified sites in spine: Secondary | ICD-10-CM | POA: Diagnosis not present

## 2024-05-04 DIAGNOSIS — K5289 Other specified noninfective gastroenteritis and colitis: Secondary | ICD-10-CM | POA: Diagnosis not present

## 2024-05-04 DIAGNOSIS — Z79899 Other long term (current) drug therapy: Secondary | ICD-10-CM | POA: Diagnosis not present

## 2024-05-04 DIAGNOSIS — M459 Ankylosing spondylitis of unspecified sites in spine: Secondary | ICD-10-CM | POA: Diagnosis not present

## 2024-05-04 DIAGNOSIS — M4690 Unspecified inflammatory spondylopathy, site unspecified: Secondary | ICD-10-CM | POA: Diagnosis not present

## 2024-05-04 DIAGNOSIS — Z1589 Genetic susceptibility to other disease: Secondary | ICD-10-CM | POA: Diagnosis not present

## 2024-05-04 DIAGNOSIS — M7580 Other shoulder lesions, unspecified shoulder: Secondary | ICD-10-CM | POA: Diagnosis not present

## 2024-05-04 DIAGNOSIS — M1990 Unspecified osteoarthritis, unspecified site: Secondary | ICD-10-CM | POA: Diagnosis not present

## 2024-05-04 DIAGNOSIS — M25551 Pain in right hip: Secondary | ICD-10-CM | POA: Diagnosis not present

## 2024-05-04 DIAGNOSIS — N182 Chronic kidney disease, stage 2 (mild): Secondary | ICD-10-CM | POA: Diagnosis not present

## 2024-05-04 DIAGNOSIS — M549 Dorsalgia, unspecified: Secondary | ICD-10-CM | POA: Diagnosis not present

## 2024-05-04 DIAGNOSIS — M25511 Pain in right shoulder: Secondary | ICD-10-CM | POA: Diagnosis not present

## 2024-05-04 DIAGNOSIS — M7060 Trochanteric bursitis, unspecified hip: Secondary | ICD-10-CM | POA: Diagnosis not present

## 2024-05-08 DIAGNOSIS — Z8619 Personal history of other infectious and parasitic diseases: Secondary | ICD-10-CM | POA: Diagnosis not present

## 2024-05-08 DIAGNOSIS — K52831 Collagenous colitis: Secondary | ICD-10-CM | POA: Diagnosis not present

## 2024-05-16 DIAGNOSIS — E611 Iron deficiency: Secondary | ICD-10-CM | POA: Diagnosis not present

## 2024-05-16 DIAGNOSIS — G2581 Restless legs syndrome: Secondary | ICD-10-CM | POA: Diagnosis not present

## 2024-05-16 DIAGNOSIS — G4733 Obstructive sleep apnea (adult) (pediatric): Secondary | ICD-10-CM | POA: Diagnosis not present

## 2024-05-17 DIAGNOSIS — M459 Ankylosing spondylitis of unspecified sites in spine: Secondary | ICD-10-CM | POA: Diagnosis not present

## 2024-05-22 DIAGNOSIS — M47819 Spondylosis without myelopathy or radiculopathy, site unspecified: Secondary | ICD-10-CM | POA: Diagnosis not present

## 2024-05-22 DIAGNOSIS — E7800 Pure hypercholesterolemia, unspecified: Secondary | ICD-10-CM | POA: Diagnosis not present

## 2024-05-22 DIAGNOSIS — M059 Rheumatoid arthritis with rheumatoid factor, unspecified: Secondary | ICD-10-CM | POA: Diagnosis not present

## 2024-05-22 DIAGNOSIS — M459 Ankylosing spondylitis of unspecified sites in spine: Secondary | ICD-10-CM | POA: Diagnosis not present

## 2024-05-29 DIAGNOSIS — H5340 Unspecified visual field defects: Secondary | ICD-10-CM | POA: Diagnosis not present

## 2024-05-29 DIAGNOSIS — Z961 Presence of intraocular lens: Secondary | ICD-10-CM | POA: Diagnosis not present

## 2024-05-29 DIAGNOSIS — H43813 Vitreous degeneration, bilateral: Secondary | ICD-10-CM | POA: Diagnosis not present

## 2024-05-30 ENCOUNTER — Other Ambulatory Visit: Payer: Self-pay | Admitting: Family Medicine

## 2024-05-30 DIAGNOSIS — H349 Unspecified retinal vascular occlusion: Secondary | ICD-10-CM

## 2024-06-01 ENCOUNTER — Ambulatory Visit
Admission: RE | Admit: 2024-06-01 | Discharge: 2024-06-01 | Disposition: A | Discharge status: Home / Self Care | Source: Ambulatory Visit | Attending: Family Medicine | Admitting: Family Medicine

## 2024-06-01 DIAGNOSIS — H349 Unspecified retinal vascular occlusion: Secondary | ICD-10-CM

## 2024-06-01 DIAGNOSIS — I6522 Occlusion and stenosis of left carotid artery: Secondary | ICD-10-CM | POA: Diagnosis not present

## 2024-06-04 DIAGNOSIS — H43813 Vitreous degeneration, bilateral: Secondary | ICD-10-CM | POA: Diagnosis not present

## 2024-06-04 DIAGNOSIS — H43312 Vitreous membranes and strands, left eye: Secondary | ICD-10-CM | POA: Diagnosis not present

## 2024-06-04 DIAGNOSIS — H47011 Ischemic optic neuropathy, right eye: Secondary | ICD-10-CM | POA: Diagnosis not present

## 2024-06-14 DIAGNOSIS — M459 Ankylosing spondylitis of unspecified sites in spine: Secondary | ICD-10-CM | POA: Diagnosis not present

## 2024-06-22 DIAGNOSIS — M47819 Spondylosis without myelopathy or radiculopathy, site unspecified: Secondary | ICD-10-CM | POA: Diagnosis not present

## 2024-06-22 DIAGNOSIS — M459 Ankylosing spondylitis of unspecified sites in spine: Secondary | ICD-10-CM | POA: Diagnosis not present

## 2024-06-22 DIAGNOSIS — M059 Rheumatoid arthritis with rheumatoid factor, unspecified: Secondary | ICD-10-CM | POA: Diagnosis not present

## 2024-06-22 DIAGNOSIS — E7800 Pure hypercholesterolemia, unspecified: Secondary | ICD-10-CM | POA: Diagnosis not present

## 2024-06-26 DIAGNOSIS — M25512 Pain in left shoulder: Secondary | ICD-10-CM | POA: Diagnosis not present

## 2024-06-26 DIAGNOSIS — M25511 Pain in right shoulder: Secondary | ICD-10-CM | POA: Diagnosis not present

## 2024-07-22 DIAGNOSIS — M059 Rheumatoid arthritis with rheumatoid factor, unspecified: Secondary | ICD-10-CM | POA: Diagnosis not present

## 2024-07-22 DIAGNOSIS — E7800 Pure hypercholesterolemia, unspecified: Secondary | ICD-10-CM | POA: Diagnosis not present

## 2024-07-22 DIAGNOSIS — M459 Ankylosing spondylitis of unspecified sites in spine: Secondary | ICD-10-CM | POA: Diagnosis not present

## 2024-07-22 DIAGNOSIS — M47819 Spondylosis without myelopathy or radiculopathy, site unspecified: Secondary | ICD-10-CM | POA: Diagnosis not present

## 2024-07-23 DIAGNOSIS — H43313 Vitreous membranes and strands, bilateral: Secondary | ICD-10-CM | POA: Diagnosis not present

## 2024-07-23 DIAGNOSIS — H43813 Vitreous degeneration, bilateral: Secondary | ICD-10-CM | POA: Diagnosis not present

## 2024-08-01 DIAGNOSIS — H43312 Vitreous membranes and strands, left eye: Secondary | ICD-10-CM | POA: Diagnosis not present

## 2024-08-02 DIAGNOSIS — M549 Dorsalgia, unspecified: Secondary | ICD-10-CM | POA: Diagnosis not present

## 2024-08-02 DIAGNOSIS — M1990 Unspecified osteoarthritis, unspecified site: Secondary | ICD-10-CM | POA: Diagnosis not present

## 2024-08-02 DIAGNOSIS — N182 Chronic kidney disease, stage 2 (mild): Secondary | ICD-10-CM | POA: Diagnosis not present

## 2024-08-02 DIAGNOSIS — Z1589 Genetic susceptibility to other disease: Secondary | ICD-10-CM | POA: Diagnosis not present

## 2024-08-02 DIAGNOSIS — R748 Abnormal levels of other serum enzymes: Secondary | ICD-10-CM | POA: Diagnosis not present

## 2024-08-02 DIAGNOSIS — Z79899 Other long term (current) drug therapy: Secondary | ICD-10-CM | POA: Diagnosis not present

## 2024-08-02 DIAGNOSIS — M25511 Pain in right shoulder: Secondary | ICD-10-CM | POA: Diagnosis not present

## 2024-08-02 DIAGNOSIS — M4690 Unspecified inflammatory spondylopathy, site unspecified: Secondary | ICD-10-CM | POA: Diagnosis not present

## 2024-08-02 DIAGNOSIS — M459 Ankylosing spondylitis of unspecified sites in spine: Secondary | ICD-10-CM | POA: Diagnosis not present
